# Patient Record
Sex: Female | Born: 1955
Health system: Southern US, Community
[De-identification: ages and names within clinical notes are randomized; demographics above are authoritative.]

## PROBLEM LIST (undated history)

## (undated) DIAGNOSIS — R519 Headache, unspecified: Secondary | ICD-10-CM

## (undated) DIAGNOSIS — M810 Age-related osteoporosis without current pathological fracture: Secondary | ICD-10-CM

## (undated) DIAGNOSIS — R51 Headache: Secondary | ICD-10-CM

## (undated) DIAGNOSIS — S322XXA Fracture of coccyx, initial encounter for closed fracture: Secondary | ICD-10-CM

## (undated) DIAGNOSIS — M1990 Unspecified osteoarthritis, unspecified site: Secondary | ICD-10-CM

## (undated) DIAGNOSIS — Z789 Other specified health status: Secondary | ICD-10-CM

## (undated) DIAGNOSIS — G43909 Migraine, unspecified, not intractable, without status migrainosus: Secondary | ICD-10-CM

## (undated) DIAGNOSIS — E079 Disorder of thyroid, unspecified: Secondary | ICD-10-CM

## (undated) DIAGNOSIS — M549 Dorsalgia, unspecified: Secondary | ICD-10-CM

## (undated) HISTORY — PX: WISDOM TOOTH EXTRACTION: SHX21

## (undated) HISTORY — DX: Headache, unspecified: R51.9

## (undated) HISTORY — DX: Migraine, unspecified, not intractable, without status migrainosus: G43.909

## (undated) HISTORY — DX: Unspecified osteoarthritis, unspecified site: M19.90

## (undated) HISTORY — DX: Disorder of thyroid, unspecified: E07.9

## (undated) HISTORY — DX: Other specified health status: Z78.9

## (undated) HISTORY — DX: Age-related osteoporosis without current pathological fracture: M81.0

## (undated) HISTORY — DX: Headache: R51

---

## 2013-01-02 ENCOUNTER — Emergency Department (HOSPITAL_COMMUNITY)
Admission: EM | Admit: 2013-01-02 | Discharge: 2013-01-02 | Disposition: A | Discharge status: Home / Self Care | Payer: No Typology Code available for payment source | Attending: Emergency Medicine | Admitting: Emergency Medicine

## 2013-01-02 ENCOUNTER — Emergency Department (HOSPITAL_COMMUNITY): Payer: No Typology Code available for payment source

## 2013-01-02 ENCOUNTER — Encounter (HOSPITAL_COMMUNITY): Payer: Self-pay | Admitting: Emergency Medicine

## 2013-01-02 DIAGNOSIS — S199XXA Unspecified injury of neck, initial encounter: Secondary | ICD-10-CM | POA: Insufficient documentation

## 2013-01-02 DIAGNOSIS — Z8719 Personal history of other diseases of the digestive system: Secondary | ICD-10-CM | POA: Insufficient documentation

## 2013-01-02 DIAGNOSIS — Z79899 Other long term (current) drug therapy: Secondary | ICD-10-CM | POA: Insufficient documentation

## 2013-01-02 DIAGNOSIS — Z8781 Personal history of (healed) traumatic fracture: Secondary | ICD-10-CM | POA: Insufficient documentation

## 2013-01-02 DIAGNOSIS — S0993XA Unspecified injury of face, initial encounter: Secondary | ICD-10-CM | POA: Insufficient documentation

## 2013-01-02 DIAGNOSIS — M542 Cervicalgia: Secondary | ICD-10-CM | POA: Diagnosis present

## 2013-01-02 DIAGNOSIS — Y92410 Unspecified street and highway as the place of occurrence of the external cause: Secondary | ICD-10-CM | POA: Insufficient documentation

## 2013-01-02 DIAGNOSIS — S6990XA Unspecified injury of unspecified wrist, hand and finger(s), initial encounter: Secondary | ICD-10-CM | POA: Insufficient documentation

## 2013-01-02 DIAGNOSIS — M79641 Pain in right hand: Secondary | ICD-10-CM | POA: Diagnosis present

## 2013-01-02 DIAGNOSIS — Y9389 Activity, other specified: Secondary | ICD-10-CM | POA: Insufficient documentation

## 2013-01-02 HISTORY — DX: Fracture of coccyx, initial encounter for closed fracture: S32.2XXA

## 2013-01-02 HISTORY — DX: Dorsalgia, unspecified: M54.9

## 2013-01-02 MED ORDER — CYCLOBENZAPRINE HCL 10 MG PO TABS: 10.0000 mg | ORAL_TABLET | Freq: Two times a day (BID) | ORAL | Status: DC | PRN

## 2013-01-02 MED ORDER — IBUPROFEN 400 MG PO TABS: 400.0000 mg | ORAL_TABLET | Freq: Four times a day (QID) | ORAL | Status: DC | PRN
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
## 2013-01-02 NOTE — Progress Notes (Signed)
Orthopedic Tech Progress Note Patient Details:  Kathy Zuniga Nov 15, 1955 [id number]  Ortho Devices Type of Ortho Device: Soft collar;Thumb velcro splint Ortho Device/Splint Interventions: Application   Cammer, Mickie Bail 01/02/2013, 3:39 PM
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
## 2013-01-07 NOTE — ED Provider Notes (Signed)
I saw and evaluated the patient, reviewed the resident's note and I agree with the findings and plan.  Vastie Douty T Shila Kruczek, MD 01/07/13 0838 

## 2013-01-27 ENCOUNTER — Encounter (HOSPITAL_COMMUNITY): Payer: Self-pay | Admitting: Emergency Medicine

## 2013-01-27 ENCOUNTER — Emergency Department (INDEPENDENT_AMBULATORY_CARE_PROVIDER_SITE_OTHER): Payer: PRIVATE HEALTH INSURANCE

## 2013-01-27 ENCOUNTER — Emergency Department (INDEPENDENT_AMBULATORY_CARE_PROVIDER_SITE_OTHER)
Admission: EM | Admit: 2013-01-27 | Discharge: 2013-01-27 | Disposition: A | Discharge status: Home / Self Care | Payer: PRIVATE HEALTH INSURANCE | Source: Home / Self Care

## 2013-01-27 DIAGNOSIS — M7062 Trochanteric bursitis, left hip: Secondary | ICD-10-CM

## 2013-01-27 DIAGNOSIS — M5430 Sciatica, unspecified side: Secondary | ICD-10-CM

## 2013-01-27 DIAGNOSIS — M25569 Pain in unspecified knee: Secondary | ICD-10-CM

## 2013-01-27 DIAGNOSIS — M76899 Other specified enthesopathies of unspecified lower limb, excluding foot: Secondary | ICD-10-CM

## 2013-01-27 DIAGNOSIS — M25562 Pain in left knee: Secondary | ICD-10-CM

## 2013-01-27 DIAGNOSIS — M5432 Sciatica, left side: Secondary | ICD-10-CM

## 2013-01-27 MED ORDER — DICLOFENAC SODIUM 75 MG PO TBEC: 75.0000 mg | DELAYED_RELEASE_TABLET | Freq: Two times a day (BID) | ORAL | Status: DC

## 2013-01-27 MED ORDER — METHOCARBAMOL 500 MG PO TABS: 500.0000 mg | ORAL_TABLET | Freq: Three times a day (TID) | ORAL | Status: DC | PRN

## 2013-01-27 MED ORDER — TRAMADOL HCL 50 MG PO TABS: 50.0000 mg | ORAL_TABLET | Freq: Four times a day (QID) | ORAL | Status: DC | PRN

## 2013-01-27 MED ORDER — METHYLPREDNISOLONE ACETATE 80 MG/ML IJ SUSP
INTRAMUSCULAR | Status: AC
  Filled 2013-01-27: qty 1

## 2013-01-27 MED ORDER — NALOXONE HCL 0.4 MG/ML IJ SOLN
INTRAMUSCULAR | Status: AC
  Filled 2013-01-27: qty 1
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
## 2013-01-27 NOTE — ED Notes (Signed)
Pt notified of appt w/[facility]) on 8/4 at 1100... Pt verbalized understanding;
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
## 2013-01-29 NOTE — ED Provider Notes (Signed)
Medical screening examination/treatment/procedure(s) were performed by resident physician or non-physician practitioner and as supervising physician I was immediately available for consultation/collaboration.   Mychele Seyller DOUGLAS MD.   Anahy Esh D Hugh Garrow, MD 01/29/13 1656 

## 2014-07-18 ENCOUNTER — Ambulatory Visit: Payer: Self-pay | Admitting: Obstetrics and Gynecology

## 2014-10-29 ENCOUNTER — Other Ambulatory Visit: Payer: Self-pay | Admitting: Internal Medicine

## 2014-10-29 ENCOUNTER — Ambulatory Visit: Payer: Self-pay

## 2014-10-29 DIAGNOSIS — M79661 Pain in right lower leg: Secondary | ICD-10-CM

## 2014-10-30 ENCOUNTER — Ambulatory Visit
Admission: RE | Admit: 2014-10-30 | Discharge: 2014-10-30 | Disposition: A | Discharge status: Home / Self Care | Payer: 59 | Source: Ambulatory Visit | Attending: Internal Medicine | Admitting: Internal Medicine

## 2014-10-30 DIAGNOSIS — M79661 Pain in right lower leg: Secondary | ICD-10-CM | POA: Insufficient documentation

## 2015-05-29 ENCOUNTER — Other Ambulatory Visit: Payer: Self-pay | Admitting: Internal Medicine

## 2015-05-29 ENCOUNTER — Ambulatory Visit
Admission: RE | Admit: 2015-05-29 | Discharge: 2015-05-29 | Disposition: A | Discharge status: Home / Self Care | Payer: 59 | Source: Ambulatory Visit | Attending: Internal Medicine | Admitting: Internal Medicine

## 2015-05-29 DIAGNOSIS — R52 Pain, unspecified: Secondary | ICD-10-CM

## 2015-07-11 DIAGNOSIS — K219 Gastro-esophageal reflux disease without esophagitis: Secondary | ICD-10-CM | POA: Insufficient documentation

## 2015-07-11 DIAGNOSIS — M810 Age-related osteoporosis without current pathological fracture: Secondary | ICD-10-CM | POA: Insufficient documentation

## 2015-07-18 ENCOUNTER — Other Ambulatory Visit: Payer: Self-pay | Admitting: Internal Medicine

## 2015-07-18 DIAGNOSIS — R102 Pelvic and perineal pain: Secondary | ICD-10-CM

## 2015-07-18 DIAGNOSIS — R1084 Generalized abdominal pain: Secondary | ICD-10-CM

## 2015-07-24 ENCOUNTER — Ambulatory Visit: Payer: No Typology Code available for payment source

## 2016-02-02 DIAGNOSIS — E876 Hypokalemia: Secondary | ICD-10-CM | POA: Diagnosis not present

## 2016-02-02 DIAGNOSIS — E871 Hypo-osmolality and hyponatremia: Secondary | ICD-10-CM | POA: Insufficient documentation

## 2016-02-02 DIAGNOSIS — Z79899 Other long term (current) drug therapy: Secondary | ICD-10-CM | POA: Diagnosis not present

## 2016-02-02 DIAGNOSIS — Z791 Long term (current) use of non-steroidal anti-inflammatories (NSAID): Secondary | ICD-10-CM | POA: Insufficient documentation

## 2016-02-02 DIAGNOSIS — R531 Weakness: Secondary | ICD-10-CM | POA: Diagnosis present

## 2016-02-03 ENCOUNTER — Ambulatory Visit
Admission: RE | Admit: 2016-02-03 | Payer: BLUE CROSS/BLUE SHIELD | Source: Ambulatory Visit | Admitting: Gastroenterology

## 2016-02-03 ENCOUNTER — Other Ambulatory Visit: Payer: Self-pay

## 2016-02-03 ENCOUNTER — Encounter: Admission: RE | Payer: Self-pay | Source: Ambulatory Visit

## 2016-02-03 ENCOUNTER — Emergency Department
Admission: EM | Admit: 2016-02-03 | Discharge: 2016-02-03 | Disposition: A | Discharge status: Home / Self Care | Payer: BLUE CROSS/BLUE SHIELD | Attending: Emergency Medicine | Admitting: Emergency Medicine

## 2016-02-03 ENCOUNTER — Encounter: Payer: Self-pay | Admitting: Emergency Medicine

## 2016-02-03 DIAGNOSIS — R531 Weakness: Secondary | ICD-10-CM

## 2016-02-03 DIAGNOSIS — E871 Hypo-osmolality and hyponatremia: Secondary | ICD-10-CM

## 2016-02-03 DIAGNOSIS — E876 Hypokalemia: Secondary | ICD-10-CM

## 2016-02-03 LAB — CBC WITH DIFFERENTIAL/PLATELET
BASOS ABS: 0 10*3/uL (ref 0–0.1)
BASOS PCT: 0 %
EOS ABS: 0 10*3/uL (ref 0–0.7)
Eosinophils Relative: 1 %
HCT: 36 % (ref 35.0–47.0)
HEMOGLOBIN: 12.8 g/dL (ref 12.0–16.0)
Lymphocytes Relative: 28 %
Lymphs Abs: 1.8 10*3/uL (ref 1.0–3.6)
MCH: 33.4 pg (ref 26.0–34.0)
MCHC: 35.5 g/dL (ref 32.0–36.0)
MCV: 93.9 fL (ref 80.0–100.0)
MONOS PCT: 9 %
Monocytes Absolute: 0.6 10*3/uL (ref 0.2–0.9)
NEUTROS ABS: 4.1 10*3/uL (ref 1.4–6.5)
NEUTROS PCT: 62 %
Platelets: 220 10*3/uL (ref 150–440)
RBC: 3.83 MIL/uL (ref 3.80–5.20)
RDW: 13.1 % (ref 11.5–14.5)
WBC: 6.5 10*3/uL (ref 3.6–11.0)

## 2016-02-03 LAB — COMPREHENSIVE METABOLIC PANEL
ALBUMIN: 3.9 g/dL (ref 3.5–5.0)
ALK PHOS: 60 U/L (ref 38–126)
ALT: 17 U/L (ref 14–54)
AST: 26 U/L (ref 15–41)
Anion gap: 9 (ref 5–15)
BUN: <5 mg/dL — ABNORMAL LOW (ref 6–20)
CALCIUM: 8.9 mg/dL (ref 8.9–10.3)
CO2: 22 mmol/L (ref 22–32)
CREATININE: 0.42 mg/dL — AB (ref 0.44–1.00)
Chloride: 96 mmol/L — ABNORMAL LOW (ref 101–111)
GFR calc non Af Amer: >60 mL/min (ref >60)
Glucose, Bld: 115 mg/dL — ABNORMAL HIGH (ref 65–99)
Potassium: 3.1 mmol/L — ABNORMAL LOW (ref 3.5–5.1)
SODIUM: 127 mmol/L — AB (ref 135–145)
TOTAL PROTEIN: 6.2 g/dL — AB (ref 6.5–8.1)
Total Bilirubin: 0.5 mg/dL (ref 0.3–1.2)

## 2016-02-03 LAB — LIPASE, BLOOD: LIPASE: 17 U/L (ref 11–51)

## 2016-02-03 LAB — GLUCOSE, CAPILLARY: GLUCOSE-CAPILLARY: 139 mg/dL — AB (ref 65–99)

## 2016-02-03 SURGERY — COLONOSCOPY WITH PROPOFOL
Anesthesia: General

## 2016-02-03 MED ORDER — SODIUM CHLORIDE 0.9 % IV BOLUS (SEPSIS)
500.0000 mL | INTRAVENOUS | Status: AC
  Administered 2016-02-03: 500 mL via INTRAVENOUS

## 2016-02-03 MED ORDER — POTASSIUM CHLORIDE CRYS ER 20 MEQ PO TBCR: 20.0000 meq | EXTENDED_RELEASE_TABLET | Freq: Every day | ORAL | 0 refills | Status: DC

## 2016-02-03 MED ORDER — POTASSIUM CHLORIDE CRYS ER 20 MEQ PO TBCR
40.0000 meq | EXTENDED_RELEASE_TABLET | Freq: Once | ORAL | Status: AC
  Administered 2016-02-03: 40 meq via ORAL
  Filled 2016-02-03: qty 2

## 2016-02-03 MED ORDER — ONDANSETRON HCL 4 MG/2ML IJ SOLN: 4.0000 mg | INTRAMUSCULAR | Status: DC

## 2016-02-03 NOTE — ED Notes (Signed)
Dr. Forbach at bedside.  

## 2016-02-03 NOTE — ED Triage Notes (Signed)
Pt to triage, dry heaving; began colonoscopy prep at 5pm and having nausea and weakness; pt denies any pain

## 2016-02-03 NOTE — Discharge Instructions (Signed)
As we discussed, your workup was generally unremarkable except for a slightly low sodium and potassium.  This is likely related to your bowel preparation.  Since you were not able to complete the bowel prep, we recommend you return to a normal diet and follow up with your regular doctor.  Be sure to call your GI clinic first thing this morning to let them know that he will not be able to come to your procedure.  Return to the emergency department if you develop new or worsening symptoms that concern you.
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
## 2016-02-17 ENCOUNTER — Telehealth: Payer: Self-pay | Admitting: Gastroenterology

## 2016-02-17 NOTE — Telephone Encounter (Signed)
colonoscopy

## 2016-02-18 ENCOUNTER — Other Ambulatory Visit: Payer: Self-pay

## 2016-02-18 NOTE — Telephone Encounter (Signed)
Gastroenterology Pre-Procedure Review  Request Date: 03/22/2016 Requesting Physician: Dr. Rosario Jacks  PATIENT REVIEW QUESTIONS: The patient responded to the following health history questions as indicated:    1. Are you having any GI issues? no 2. Do you have a personal history of Polyps? no 3. Do you have a family history of Colon Cancer or Polyps? yes (father colon cancer) 4. Diabetes Mellitus? no 5. Joint replacements in the past 12 months?no 6. Major health problems in the past 3 months?no 7. Any artificial heart valves, MVP, or defibrillator?no    MEDICATIONS & ALLERGIES:    Patient reports the following regarding taking any anticoagulation/antiplatelet therapy:   Plavix, Coumadin, Eliquis, Xarelto, Lovenox, Pradaxa, Brilinta, or Effient? no Aspirin? no  Patient confirms/reports the following medications:  Current Outpatient Prescriptions  Medication Sig Dispense Refill  . Probiotic Product (PROBIOTIC DAILY PO) Take 1 tablet by mouth daily.    . Turmeric Curcumin 500 MG CAPS Take by mouth.     No current facility-administered medications for this visit.     Patient confirms/reports the following allergies:  Allergies  Allergen Reactions  . Asa [Aspirin] Other (See Comments)    GI bleed Gi bleed   . Gluten Meal Other (See Comments)    Pt has Celiac's Pt has Celiac's  . Ibuprofen Other (See Comments)    GI bleed    No orders of the defined types were placed in this encounter.   AUTHORIZATION INFORMATION Primary Insurance: 1D#: Group #:  Secondary Insurance: 1D#: Group #:  SCHEDULE INFORMATION: Date: 03/22/2016 Time: Location: [facility]

## 2016-02-18 NOTE — Telephone Encounter (Signed)
Screening Colonoscopy Z12.11 [facility] [id number] Please pre cert

## 2016-03-04 ENCOUNTER — Telehealth: Payer: Self-pay | Admitting: Gastroenterology

## 2016-03-04 NOTE — Telephone Encounter (Signed)
Noted. Called [place] and cancelled.

## 2016-03-04 NOTE — Telephone Encounter (Signed)
Patient needs to cancel her appointment and will call back when she is ready. She is waiting to see another dr. I didn't call downstairs to cancel

## 2016-03-22 ENCOUNTER — Ambulatory Visit: Admit: 2016-03-22 | Payer: BLUE CROSS/BLUE SHIELD | Admitting: Gastroenterology

## 2016-03-22 SURGERY — COLONOSCOPY WITH PROPOFOL
Anesthesia: General

## 2016-05-15 ENCOUNTER — Emergency Department (HOSPITAL_COMMUNITY)
Admission: EM | Admit: 2016-05-15 | Discharge: 2016-05-15 | Disposition: A | Discharge status: Home / Self Care | Payer: BLUE CROSS/BLUE SHIELD | Attending: Emergency Medicine | Admitting: Emergency Medicine

## 2016-05-15 ENCOUNTER — Encounter (HOSPITAL_COMMUNITY): Payer: Self-pay | Admitting: *Deleted

## 2016-05-15 DIAGNOSIS — M545 Low back pain, unspecified: Secondary | ICD-10-CM

## 2016-05-15 DIAGNOSIS — R935 Abnormal findings on diagnostic imaging of other abdominal regions, including retroperitoneum: Secondary | ICD-10-CM | POA: Insufficient documentation

## 2016-05-15 DIAGNOSIS — K900 Celiac disease: Secondary | ICD-10-CM | POA: Insufficient documentation

## 2016-05-15 DIAGNOSIS — M549 Dorsalgia, unspecified: Secondary | ICD-10-CM | POA: Diagnosis present

## 2016-05-15 LAB — COMPREHENSIVE METABOLIC PANEL
ALBUMIN: 3.9 g/dL (ref 3.5–5.0)
ALT: 17 U/L (ref 14–54)
AST: 20 U/L (ref 15–41)
Alkaline Phosphatase: 55 U/L (ref 38–126)
Anion gap: 6 (ref 5–15)
BILIRUBIN TOTAL: 0.8 mg/dL (ref 0.3–1.2)
CHLORIDE: 107 mmol/L (ref 101–111)
CO2: 24 mmol/L (ref 22–32)
Calcium: 9.7 mg/dL (ref 8.9–10.3)
Creatinine, Ser: 0.51 mg/dL (ref 0.44–1.00)
GFR calc Af Amer: >60 mL/min (ref >60)
GFR calc non Af Amer: >60 mL/min (ref >60)
GLUCOSE: 94 mg/dL (ref 65–99)
POTASSIUM: 3.8 mmol/L (ref 3.5–5.1)
Sodium: 137 mmol/L (ref 135–145)
Total Protein: 6.4 g/dL — ABNORMAL LOW (ref 6.5–8.1)

## 2016-05-15 LAB — URINALYSIS, ROUTINE W REFLEX MICROSCOPIC
BILIRUBIN URINE: NEGATIVE
GLUCOSE, UA: NEGATIVE mg/dL
Hgb urine dipstick: NEGATIVE
KETONES UR: 15 mg/dL — AB
Leukocytes, UA: NEGATIVE
Nitrite: NEGATIVE
PH: 6 (ref 5.0–8.0)
Protein, ur: NEGATIVE mg/dL
Specific Gravity, Urine: 1.002 — ABNORMAL LOW (ref 1.005–1.030)

## 2016-05-15 LAB — CBC WITH DIFFERENTIAL/PLATELET
Basophils Absolute: 0 10*3/uL (ref 0.0–0.1)
Basophils Relative: 0 %
Eosinophils Absolute: 0 10*3/uL (ref 0.0–0.7)
Eosinophils Relative: 0 %
HEMATOCRIT: 39.5 % (ref 36.0–46.0)
Hemoglobin: 13.2 g/dL (ref 12.0–15.0)
LYMPHS PCT: 47 %
Lymphs Abs: 2.3 10*3/uL (ref 0.7–4.0)
MCH: 31.8 pg (ref 26.0–34.0)
MCHC: 33.4 g/dL (ref 30.0–36.0)
MCV: 95.2 fL (ref 78.0–100.0)
MONO ABS: 0.3 10*3/uL (ref 0.1–1.0)
MONOS PCT: 6 %
NEUTROS ABS: 2.3 10*3/uL (ref 1.7–7.7)
Neutrophils Relative %: 47 %
Platelets: 256 10*3/uL (ref 150–400)
RBC: 4.15 MIL/uL (ref 3.87–5.11)
RDW: 13 % (ref 11.5–15.5)
WBC: 4.9 10*3/uL (ref 4.0–10.5)

## 2016-05-15 LAB — LIPASE, BLOOD: Lipase: 30 U/L (ref 11–51)

## 2016-05-15 MED ORDER — GI COCKTAIL ~~LOC~~
30.0000 mL | Freq: Once | ORAL | Status: AC
  Administered 2016-05-15: 30 mL via ORAL
  Filled 2016-05-15: qty 30
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
## 2016-05-15 NOTE — Discharge Instructions (Signed)

## 2016-05-15 NOTE — ED Triage Notes (Addendum)
Pt has right flank pain x 2 days. Denies urinary symptoms but reports chills.

## 2016-06-03 ENCOUNTER — Encounter: Payer: Self-pay | Admitting: Certified Nurse Midwife

## 2016-06-06 ENCOUNTER — Encounter: Payer: Self-pay | Admitting: Internal Medicine

## 2016-06-07 ENCOUNTER — Ambulatory Visit (INDEPENDENT_AMBULATORY_CARE_PROVIDER_SITE_OTHER): Payer: BLUE CROSS/BLUE SHIELD | Admitting: Internal Medicine

## 2016-06-07 ENCOUNTER — Encounter: Payer: Self-pay | Admitting: Internal Medicine

## 2016-06-07 ENCOUNTER — Other Ambulatory Visit: Payer: Self-pay | Admitting: *Deleted

## 2016-06-07 VITALS — BP 118/82 | HR 82 | Temp 97.6°F | Ht 64.5 in | Wt 135.0 lb

## 2016-06-07 DIAGNOSIS — M810 Age-related osteoporosis without current pathological fracture: Secondary | ICD-10-CM

## 2016-06-07 DIAGNOSIS — K900 Celiac disease: Secondary | ICD-10-CM | POA: Insufficient documentation

## 2016-06-07 DIAGNOSIS — K580 Irritable bowel syndrome with diarrhea: Secondary | ICD-10-CM | POA: Diagnosis not present

## 2016-06-07 DIAGNOSIS — E039 Hypothyroidism, unspecified: Secondary | ICD-10-CM | POA: Diagnosis not present

## 2016-06-07 DIAGNOSIS — K219 Gastro-esophageal reflux disease without esophagitis: Secondary | ICD-10-CM | POA: Diagnosis not present

## 2016-06-07 DIAGNOSIS — M545 Low back pain, unspecified: Secondary | ICD-10-CM

## 2016-06-07 DIAGNOSIS — M549 Dorsalgia, unspecified: Secondary | ICD-10-CM

## 2016-06-07 DIAGNOSIS — G8929 Other chronic pain: Secondary | ICD-10-CM | POA: Insufficient documentation

## 2016-06-07 DIAGNOSIS — M546 Pain in thoracic spine: Secondary | ICD-10-CM

## 2016-06-07 NOTE — Patient Instructions (Signed)
Breast Self-Awareness Introduction Breast self-awareness means being familiar with how your breasts look and feel. It involves checking your breasts regularly and reporting any changes to your health care provider. Practicing breast self-awareness is important. A change in your breasts can be a sign of a serious medical problem. Being familiar with how your breasts look and feel allows you to find any problems early, when treatment is more likely to be successful. All women should practice breast self-awareness, including women who have had breast implants. How to do a breast self-exam One way to learn what is normal for your breasts and whether your breasts are changing is to do a breast self-exam. To do a breast self-exam: Look for Changes  1. Remove all the clothing above your waist. 2. Stand in front of a mirror in a room with good lighting. 3. Put your hands on your hips. 4. Push your hands firmly downward. 5. Compare your breasts in the mirror. Look for differences between them (asymmetry), such as:  Differences in shape.  Differences in size.  Puckers, dips, and bumps in one breast and not the other. 6. Look at each breast for changes in your skin, such as:  Redness.  Scaly areas. 7. Look for changes in your nipples, such as:  Discharge.  Bleeding.  Dimpling.  Redness.  A change in position. Feel for Changes  Carefully feel your breasts for lumps and changes. It is best to do this while lying on your back on the floor and again while sitting or standing in the shower or tub with soapy water on your skin. Feel each breast in the following way:  Place the arm on the side of the breast you are examining above your head.  Feel your breast with the other hand.  Start in the nipple area and make  inch (2 cm) overlapping circles to feel your breast. Use the pads of your three middle fingers to do this. Apply light pressure, then medium pressure, then firm pressure. The light  pressure will allow you to feel the tissue closest to the skin. The medium pressure will allow you to feel the tissue that is a little deeper. The firm pressure will allow you to feel the tissue close to the ribs.  Continue the overlapping circles, moving downward over the breast until you feel your ribs below your breast.  Move one finger-width toward the center of the body. Continue to use the  inch (2 cm) overlapping circles to feel your breast as you move slowly up toward your collarbone.  Continue the up and down exam using all three pressures until you reach your armpit. Write Down What You Find  Write down what is normal for each breast and any changes that you find. Keep a written record with breast changes or normal findings for each breast. By writing this information down, you do not need to depend only on memory for size, tenderness, or location. Write down where you are in your menstrual cycle, if you are still menstruating. If you are having trouble noticing differences in your breasts, do not get discouraged. With time you will become more familiar with the variations in your breasts and more comfortable with the exam. How often should I examine my breasts? Examine your breasts every month. If you are breastfeeding, the best time to examine your breasts is after a feeding or after using a breast pump. If you menstruate, the best time to examine your breasts is 5-7 days after your  period is over. During your period, your breasts are lumpier, and it may be more difficult to notice changes. When should I see my health care provider? See your health care provider if you notice:  A change in shape or size of your breasts or nipples.  A change in the skin of your breast or nipples, such as a reddened or scaly area.  Unusual discharge from your nipples.  A lump or thick area that was not there before.  Pain in your breasts.  Anything that concerns you. This information is not  intended to replace advice given to you by your health care provider. Make sure you discuss any questions you have with your health care provider. Document Released: 06/14/2005 Document Revised: 11/20/2015 Document Reviewed: 05/04/2015  2017 [employer]

## 2016-06-07 NOTE — Progress Notes (Signed)
Date:  06/07/2016   Name:  Kathy Zuniga   DOB:  24-Oct-1955   MRN:  [id number]   Chief Complaint: Establish Care Thyroid Problem  Presents for follow-up visit. Symptoms include diarrhea. Patient reports no anxiety, constipation, fatigue or palpitations. The symptoms have been stable (starting armour thyroid from Osteopath).  Back Pain  This is a chronic problem. The current episode started more than 1 year ago. The problem occurs intermittently. The pain is present in the lumbar spine. The pain does not radiate. The symptoms are aggravated by standing. Pertinent negatives include no abdominal pain, chest pain, dysuria or fever. Risk factors include history of osteoporosis. She has tried chiropractic manipulation and ice for the symptoms.   Menopausal symptoms - prefers Homeopathic medications.  Supposed to start HRT but is waiting to get her thyroid balanced.  Just started Armour Thyroid.  Osteoporosis - osteoporois at spine on DEXA 2016 and osteopenia at hip.  Has hx of fracture of coccyx 2012 and several digit fractures.  She does not want to take prescription medications.  She is starting thyroid supplements and will soon start HRT from [facility].  Bowel problems - she has bloating and gas with intermittent diarrhea.  She denies blood or weight loss.  Earlier this year tried to have a colonoscopy but became very sick from the prep - requiring IVF in ER as well as K+ supplementation.  She then tried to get one from Dr. Allen Norris but the prep was very expensive and she became to anxious to proceed.  She knows she needs to have the exam but would prefer to see GI first then schedule.  She once thought that she was sensitive to gluten but recent blood testing for food allergies was negative for grains.  She does not think that she ever had a celiac panel.  Review of Systems  Constitutional: Negative for chills, fatigue and fever.  Eyes: Negative for visual disturbance.  Respiratory: Negative  for cough, chest tightness, shortness of breath and wheezing.   Cardiovascular: Negative for chest pain, palpitations and leg swelling.  Gastrointestinal: Positive for abdominal distention and diarrhea. Negative for abdominal pain, blood in stool, constipation, rectal pain and vomiting.  Genitourinary: Positive for genital sores (intermittent labial inflammation -seen by GYN with normal exam). Negative for dysuria.  Musculoskeletal: Positive for back pain. Negative for gait problem and myalgias.  Skin: Negative for color change and rash.  Hematological: Negative for adenopathy.  Psychiatric/Behavioral: Negative for sleep disturbance. The patient is not nervous/anxious.     Patient Active Problem List   Diagnosis Date Noted  . Back pain, lumbosacral 06/07/2016  . Irritable bowel syndrome with diarrhea 06/07/2016  . GERD without esophagitis 07/11/2015  . Osteoporosis 07/11/2015    Prior to Admission medications   Medication Sig Start Date End Date Taking? Authorizing Provider  Probiotic Product (PROBIOTIC DAILY PO) Take 1 tablet by mouth daily.   Yes Historical Provider, MD  thyroid (ARMOUR) 15 MG tablet Take 15 mg by mouth daily.   Yes Historical Provider, MD  Turmeric Curcumin 500 MG CAPS Take 500 mg by mouth daily.    Yes Historical Provider, MD    Allergies  Allergen Reactions  . Almond Meal   . Asa [Aspirin] Other (See Comments)    GI bleed Gi bleed   . Crab [Shellfish Allergy]     clams  . Ibuprofen Other (See Comments)    GI bleed  . Other     Green Beans  .  Spinach     History reviewed. No pertinent surgical history.  Social History  Substance Use Topics  . Smoking status: Never Smoker  . Smokeless tobacco: Never Used  . Alcohol use No     Medication list has been reviewed and updated.   Physical Exam  Constitutional: She is oriented to person, place, and time. She appears well-developed. No distress.  HENT:  Head: Normocephalic and atraumatic.  Neck:  Normal range of motion. Neck supple. No thyromegaly present.  Cardiovascular: Normal rate, regular rhythm and normal heart sounds.   Pulmonary/Chest: Effort normal and breath sounds normal. No respiratory distress. She has no wheezes.  Abdominal: Soft. Bowel sounds are normal. She exhibits no distension. There is no tenderness. There is no rebound and no guarding.  Musculoskeletal: Normal range of motion. She exhibits no edema.       Thoracic back: She exhibits tenderness.       Lumbar back: She exhibits no tenderness and no spasm.  Neurological: She is alert and oriented to person, place, and time. She has normal reflexes.  Skin: Skin is warm and dry. No rash noted.  Psychiatric: She has a normal mood and affect. Her behavior is normal. Thought content normal.  Nursing note and vitals reviewed.   BP 118/82   Pulse 82   Temp 97.6 F (36.4 C)   Ht 5' 4.5" (1.638 m)   Wt 135 lb (61.2 kg)   BMI 22.81 kg/m   Assessment and Plan: 1. Back pain of thoracolumbar region Continue chiropractor, stretching and ice Avoid straining forward   2. Irritable bowel syndrome with diarrhea Needs to discuss symptoms as well as how to manage colon prep - Ambulatory referral to Gastroenterology  3. Osteoporosis without current pathological fracture, unspecified osteoporosis type Begin calcium; continue vitamin D Resume HRT in the near future DEXA at 3 year intervals  4. GERD without esophagitis On no medication currently.  5. Acquired hypothyroidism Mildly decreased T4 - starting armour thyroid  Halina Maidens, MD [facility]  06/07/2016

## 2016-07-05 ENCOUNTER — Ambulatory Visit: Payer: BLUE CROSS/BLUE SHIELD | Admitting: Gastroenterology

## 2016-07-13 ENCOUNTER — Encounter: Payer: Self-pay | Admitting: Certified Nurse Midwife

## 2016-08-10 ENCOUNTER — Ambulatory Visit: Payer: BLUE CROSS/BLUE SHIELD | Admitting: Gastroenterology

## 2016-08-17 ENCOUNTER — Ambulatory Visit: Payer: BLUE CROSS/BLUE SHIELD | Admitting: Certified Nurse Midwife

## 2016-08-17 ENCOUNTER — Encounter: Payer: Self-pay | Admitting: Certified Nurse Midwife

## 2016-08-17 VITALS — BP 116/70 | HR 72 | Resp 16 | Ht 64.25 in | Wt 131.0 lb

## 2016-08-17 DIAGNOSIS — Z01419 Encounter for gynecological examination (general) (routine) without abnormal findings: Secondary | ICD-10-CM | POA: Diagnosis not present

## 2016-08-17 DIAGNOSIS — Z124 Encounter for screening for malignant neoplasm of cervix: Secondary | ICD-10-CM | POA: Diagnosis not present

## 2016-08-17 DIAGNOSIS — M810 Age-related osteoporosis without current pathological fracture: Secondary | ICD-10-CM | POA: Diagnosis not present

## 2016-08-17 DIAGNOSIS — N898 Other specified noninflammatory disorders of vagina: Secondary | ICD-10-CM

## 2016-08-17 NOTE — Patient Instructions (Signed)

## 2016-08-17 NOTE — Progress Notes (Addendum)
61 y.o. G3P3 Married  Caucasian Fe here to establish gyn care as a new patient for an annual exam. Menopausal no HRT.  LMP approximately 4 years ago. Had one episode of bleeding then and none since. Some hot flashes occasional. Patient also complains of some vaginal irritation, has been on a real strict Candida diet and has been off for a few days due to company. Now back on. Previous history of chronic yeast infection and numerous medications. Diet works best.. She found that with decrease of sugar this works for her. Per patient has not noticed any vaginal  discharge. No itching today. Patient also has vaginal dryness.  Patient has osteoporosis. Sees [facility] for hypothyroid management and has recommended HRT and Testosterone after labs to treat osteoporosis. Patient has not had repeat BMD in 2 years. Was seeing PCP regarding and was not happy with traditional care. Not sure if she would consider medications due to side effects. Also will prefers no mammogram due to increase risk with xray. Plans thermography and aware of limitations.  No other health concerns today.  No LMP recorded. Patient is postmenopausal.  06/29/2011??        Sexually active: No.  The current method of family planning is post menopausal status.    Exercising: Yes.    walking, light weights Smoker:  no  Health Maintenance: Pap:  Per patient around 2016 no abnormals MMG:  Per patient about 7-8 years ago; Patient declines another mammogram. Has thermograms. Would choose not to have mammogram unless mass noted Colonoscopy:  Patient has not had colonoscopy. Has an appointment with GI, Dr. Vicente Males to discuss.. Unable to complete due to seizures with preparatory medications. Has ordered cologard to do at home.  BMD:   2 years ago per patient - Osteoporosis noted at that time per record  07/18/14 Spine L1-L4 -3.9, Right femur neck :-2.1 Osteopenia TDaP:  03/2014 per patient Shingles: never plans to take Pneumonia: never Hep C  and HIV: discuss today Labs: discuss today Self breast exam: occasionally  reports that she has never smoked. She has never used smokeless tobacco. She reports that she does not use drugs.  Past Medical History:  Diagnosis Date  . Back pain   . Celiac disease   . Fracture of coccyx ([facility])   . Osteoporosis   . Thyroid disease     History reviewed. No pertinent surgical history.  Current Outpatient Prescriptions  Medication Sig Dispense Refill  . Cholecalciferol (VITAMIN D3) 5000 units TABS Take by mouth.    . Cyanocobalamin (BL VITAMIN B-12 PO) Take by mouth.    . Glutamine POWD Take by mouth.    Marland Kitchen MAGNESIUM MALATE PO Take by mouth.    . Menaquinone-7 (VITAMIN K2 PO) Take by mouth.    . Nutritional Supplements (DHEA PO) Take by mouth.    . Probiotic Product (PROBIOTIC DAILY PO) Take 1 tablet by mouth daily.    Marland Kitchen thyroid (ARMOUR) 15 MG tablet Take 15 mg by mouth daily.    . Turmeric Curcumin 500 MG CAPS Take 500 mg by mouth daily.      No current facility-administered medications for this visit.     Family History  Problem Relation Age of Onset  . Osteoporosis Mother   . Colon cancer Father   . Heart failure Father     ROS:  Pertinent items are noted in HPI.  Otherwise, a comprehensive ROS was negative.  Exam:   BP 116/70 (BP Location: Right Arm, Patient  Position: Sitting, Cuff Size: Normal)   Pulse 72   Resp 16   Ht 5' 4.25" (1.632 m)   Wt 131 lb (59.4 kg)   BMI 22.31 kg/m  Height: 5' 4.25" (163.2 cm) Ht Readings from Last 3 Encounters:  08/17/16 5' 4.25" (1.632 m)  06/07/16 5' 4.5" (1.638 m)  02/03/16 5\' 6"  (1.676 m)    General appearance: alert, cooperative and appears stated age Head: Normocephalic, without obvious abnormality, atraumatic Neck: no adenopathy, supple, symmetrical, trachea midline and thyroid normal to inspection and palpation Lungs: clear to auscultation bilaterally Breasts: normal appearance, no masses or tenderness, No nipple retraction or  dimpling, No nipple discharge or bleeding, No axillary or supraclavicular adenopathy Heart: regular rate and rhythm Abdomen: soft, non-tender; no masses,  no organomegaly Extremities: extremities normal, atraumatic, no cyanosis or edema Skin: Skin color, texture, turgor normal. No rashes or lesions Lymph nodes: Cervical, supraclavicular, and axillary nodes normal. No abnormal inguinal nodes palpated Neurologic: Grossly normal   Pelvic: External genitalia:  no lesions              Urethra:  normal appearing urethra with no masses, tenderness or lesions              Bartholin's and Skene's: normal                 Vagina: normal appearing vagina with normal color and discharge, no lesions              Cervix: multiparous appearance, no cervical motion tenderness and no lesions              Pap taken: Yes.   Bimanual Exam:  Uterus:  normal size, contour, position, consistency, mobility, non-tender              Adnexa: normal adnexa and no mass, fullness, tenderness               Rectovaginal: Confirms               Anus:  normal sphincter tone, no lesions  Chaperone present: yes  A:  Well Woman with normal exam  Menopausal no HRT  Atrophic vaginitis  Chronic yeast history R/O infection  Osteoporosis history BMD due, no therapy at present, considering HRT with [facility]  Mammogram due declines will have thermography  Hypothyroid with [facility] management  P:   Reviewed health and wellness pertinent to exam  Discussed importance of advising if vaginal bleeding.  Discussed vaginal findings and options for treatment. Estrogen or OTC, risks/benefits given. Will try coconut oil, instructions given. Will advise if no change.  Lab: Affirm will treat if indicated  Discussed risks with Osteoporosis of fracture and would like to see actual BMD report and need to have up to date one now. Patient agreeable to scheduling, will do prior to leaving today. Discussed importance  of calcium and Vitamin D, and weight bearing exercise. had labs with [facility]. Normal per patient.  Discussed limitations and no standard for thermography, patient aware and prefers and will schedule. Will consent to mammogram if thermography is abnormal.  Continue follow up as indicated.  Pap smear as above with HPVHR   counseled on breast self exam, mammography screening, use and side effects of HRT after menopause, adequate intake of calcium and vitamin D, diet and exercise  return annually or prn  An After Visit Summary was printed and given to the patient.

## 2016-08-18 LAB — WET PREP BY MOLECULAR PROBE
CANDIDA SPECIES: NEGATIVE
GARDNERELLA VAGINALIS: NEGATIVE
Trichomonas vaginosis: NEGATIVE

## 2016-08-18 NOTE — Progress Notes (Signed)
Encounter reviewed Jill Jertson, MD   

## 2016-08-19 LAB — IPS PAP TEST WITH HPV

## 2016-09-01 ENCOUNTER — Ambulatory Visit: Payer: BLUE CROSS/BLUE SHIELD | Admitting: Gastroenterology

## 2016-09-07 ENCOUNTER — Ambulatory Visit (INDEPENDENT_AMBULATORY_CARE_PROVIDER_SITE_OTHER): Payer: BLUE CROSS/BLUE SHIELD | Admitting: Internal Medicine

## 2016-09-07 ENCOUNTER — Ambulatory Visit
Admission: RE | Admit: 2016-09-07 | Discharge: 2016-09-07 | Disposition: A | Discharge status: Home / Self Care | Payer: BLUE CROSS/BLUE SHIELD | Source: Ambulatory Visit | Attending: Internal Medicine | Admitting: Internal Medicine

## 2016-09-07 ENCOUNTER — Encounter: Payer: Self-pay | Admitting: Internal Medicine

## 2016-09-07 VITALS — BP 112/64 | HR 78 | Ht 64.25 in | Wt 128.8 lb

## 2016-09-07 DIAGNOSIS — M546 Pain in thoracic spine: Secondary | ICD-10-CM | POA: Insufficient documentation

## 2016-09-07 DIAGNOSIS — M8580 Other specified disorders of bone density and structure, unspecified site: Secondary | ICD-10-CM | POA: Insufficient documentation

## 2016-09-07 DIAGNOSIS — M419 Scoliosis, unspecified: Secondary | ICD-10-CM | POA: Insufficient documentation

## 2016-09-07 MED ORDER — METHOCARBAMOL 500 MG PO TABS: 500.0000 mg | ORAL_TABLET | Freq: Three times a day (TID) | ORAL | 0 refills | Status: DC | PRN

## 2016-09-07 NOTE — Progress Notes (Signed)
Date:  09/07/2016   Name:  Kathy Zuniga   DOB:  [birth year]   MRN:  [id number]   Chief Complaint: Back Pain (Started X 5 days ago. Was so bad had nausea with pain. No injury but "may have layed awkwardly on side."  Was sick with the flu when this occured. Pain is a level of  "above 10.") Back Pain  This is a new problem. The current episode started in the past 7 days. The problem occurs constantly. The problem has been gradually improving since onset. The pain is present in the thoracic spine. The quality of the pain is described as aching. The pain does not radiate. The pain is at a severity of 4/10. The pain is moderate. The symptoms are aggravated by lying down. Pertinent negatives include no abdominal pain, bladder incontinence, bowel incontinence, chest pain, dysuria, fever, headaches or weakness. Treatments tried: bone support and Hemp oil po. The treatment provided mild relief.   She is going to [facility] service for bone therapy support medications and Hemp oil for pain. She is also now on HRT. She very wary regarding plain xrays and dx value.  She is not interested in kyphoplasty if she has a compression fx.  She has osteoporosis and has declined traditional medicine in the past in favor of homeopathic remedies and supplements.   Review of Systems  Constitutional: Negative for chills, fatigue and fever.  Respiratory: Negative for cough, chest tightness and shortness of breath.   Cardiovascular: Negative for chest pain, palpitations and leg swelling.  Gastrointestinal: Negative for abdominal pain and bowel incontinence.  Genitourinary: Negative for bladder incontinence, dysuria and hematuria.  Musculoskeletal: Positive for back pain. Negative for gait problem, neck pain and neck stiffness.  Neurological: Negative for dizziness, tremors, weakness and headaches.  Hematological: Negative for adenopathy.    Patient Active Problem List   Diagnosis Date Noted  . Back  pain, lumbosacral 06/07/2016  . Irritable bowel syndrome with diarrhea 06/07/2016  . GERD without esophagitis 07/11/2015  . Osteoporosis 07/11/2015    Prior to Admission medications   Medication Sig Start Date End Date Taking? Authorizing Provider  Cholecalciferol (VITAMIN D3) 5000 units TABS Take by mouth.   Yes Historical Provider, MD  Cyanocobalamin (BL VITAMIN B-12 PO) Take by mouth.   Yes Historical Provider, MD  Glutamine POWD Take by mouth.   Yes Historical Provider, MD  MAGNESIUM MALATE PO Take by mouth.   Yes Historical Provider, MD  Menaquinone-7 (VITAMIN K2 PO) Take by mouth.   Yes Historical Provider, MD  Nutritional Supplements (DHEA PO) Take by mouth.   Yes Historical Provider, MD  Probiotic Product (PROBIOTIC DAILY PO) Take 1 tablet by mouth daily.   Yes Historical Provider, MD  thyroid (ARMOUR) 15 MG tablet Take 15 mg by mouth daily.   Yes Historical Provider, MD    Allergies  Allergen Reactions  . Almond Meal   . Asa [Aspirin] Other (See Comments)    GI bleed Gi bleed   . Crab [Shellfish Allergy]     clams  . Ibuprofen Other (See Comments)    GI bleed  . Other     Green Beans  . Spinach     History reviewed. No pertinent surgical history.  Social History  Substance Use Topics  . Smoking status: Never Smoker  . Smokeless tobacco: Never Used  . Alcohol use Not on file     Comment: occasionally     Medication list has been reviewed  and updated.   Physical Exam  Constitutional: She is oriented to person, place, and time. She appears well-developed. No distress.  HENT:  Head: Normocephalic and atraumatic.  Neck: No thyromegaly present.  Cardiovascular: Normal rate, regular rhythm and normal heart sounds.   Pulmonary/Chest: Effort normal and breath sounds normal. No respiratory distress. She has no wheezes.  Musculoskeletal: Normal range of motion.       Thoracic back: She exhibits spasm. She exhibits no bony tenderness and no swelling.    Neurological: She is alert and oriented to person, place, and time. She has normal strength. No sensory deficit.  Skin: Skin is warm and dry. No rash noted.  Psychiatric: She has a normal mood and affect. Her speech is normal and behavior is normal. Thought content normal.  Nursing note and vitals reviewed.   BP 112/64   Pulse 78   Ht 5' 4.25" (1.632 m)   Wt 128 lb 12.8 oz (58.4 kg)   SpO2 99%   BMI 21.94 kg/m   Assessment and Plan: 1. Acute bilateral thoracic back pain Suspect compression fx Continue current management - DG THORACOLUMABAR SPINE; Future - methocarbamol (ROBAXIN) 500 MG tablet; Take 1 tablet (500 mg total) by mouth every 8 (eight) hours as needed for muscle spasms.  Dispense: 30 tablet; Refill: 0   Meds ordered this encounter  Medications  . methocarbamol (ROBAXIN) 500 MG tablet    Sig: Take 1 tablet (500 mg total) by mouth every 8 (eight) hours as needed for muscle spasms.    Dispense:  30 tablet    Refill:  0    Halina Maidens, MD [facility]  09/07/2016

## 2016-09-08 NOTE — Progress Notes (Signed)
Pt asking to still do MRI- stating she viewed the report on My Chart and read MRI can happen now?

## 2016-09-16 ENCOUNTER — Encounter: Payer: Self-pay | Admitting: Internal Medicine

## 2016-09-27 ENCOUNTER — Encounter: Payer: Self-pay | Admitting: Internal Medicine

## 2016-09-28 ENCOUNTER — Ambulatory Visit: Payer: BLUE CROSS/BLUE SHIELD | Admitting: Gastroenterology

## 2016-12-06 ENCOUNTER — Encounter: Payer: Self-pay | Admitting: Internal Medicine

## 2016-12-06 ENCOUNTER — Ambulatory Visit (INDEPENDENT_AMBULATORY_CARE_PROVIDER_SITE_OTHER): Payer: BLUE CROSS/BLUE SHIELD | Admitting: Internal Medicine

## 2016-12-06 VITALS — BP 100/70 | HR 67 | Temp 98.0°F | Resp 17 | Ht 64.25 in | Wt 131.0 lb

## 2016-12-06 DIAGNOSIS — K580 Irritable bowel syndrome with diarrhea: Secondary | ICD-10-CM | POA: Diagnosis not present

## 2016-12-06 DIAGNOSIS — M546 Pain in thoracic spine: Secondary | ICD-10-CM | POA: Diagnosis not present

## 2016-12-06 DIAGNOSIS — K649 Unspecified hemorrhoids: Secondary | ICD-10-CM | POA: Insufficient documentation

## 2016-12-06 DIAGNOSIS — G8929 Other chronic pain: Secondary | ICD-10-CM | POA: Insufficient documentation

## 2016-12-06 DIAGNOSIS — M654 Radial styloid tenosynovitis [de Quervain]: Secondary | ICD-10-CM | POA: Insufficient documentation

## 2016-12-06 NOTE — Progress Notes (Signed)
Date:  12/06/2016   Name:  Kathy Zuniga   DOB:  Nov 02, 1955   MRN:  [id number]   Chief Complaint: Follow-up Back Pain  This is a chronic problem. The problem occurs daily. The problem has been gradually worsening since onset. The pain is present in the thoracic spine. The quality of the pain is described as aching and stabbing. The pain does not radiate. The symptoms are aggravated by bending and twisting. Pertinent negatives include no abdominal pain, bladder incontinence, bowel incontinence, chest pain, fever, numbness or weakness. She has tried chiropractic manipulation (hemp oil extract) for the symptoms. The treatment provided moderate (she has known mild scoliosis, probably mild thoracic compression fracture earlier this year.  Had to stop working due to pain.  Has never seen Ortho.  Is considering applying for disability.) relief.  Rectal Bleeding   The current episode started more than 2 weeks ago. The onset was sudden. The problem has been unchanged. The patient is experiencing no pain. The stool is described as soft and bloody. Pertinent negatives include no fever, no abdominal pain, no diarrhea and no chest pain.    Review of Systems  Constitutional: Negative for chills, fatigue and fever.  Eyes: Negative for visual disturbance.  Respiratory: Negative for choking, shortness of breath and wheezing.   Cardiovascular: Negative for chest pain, palpitations and leg swelling.  Gastrointestinal: Positive for blood in stool and hematochezia. Negative for abdominal pain, bowel incontinence, constipation and diarrhea.  Genitourinary: Negative for bladder incontinence and difficulty urinating.  Musculoskeletal: Positive for arthralgias and back pain. Negative for gait problem.  Neurological: Negative for dizziness, tremors, syncope, weakness and numbness.  Psychiatric/Behavioral: Negative for sleep disturbance.    Patient Active Problem List   Diagnosis Date Noted  . Radial styloid  tenosynovitis 12/06/2016  . Back pain, lumbosacral 06/07/2016  . Irritable bowel syndrome with diarrhea 06/07/2016  . GERD without esophagitis 07/11/2015  . Osteoporosis 07/11/2015    Prior to Admission medications   Medication Sig Start Date End Date Taking? Authorizing Provider  Cholecalciferol (VITAMIN D3) 5000 units TABS Take 5,000 each by mouth daily.    Yes [provider]  Cyanocobalamin (BL VITAMIN B-12 PO) Take by mouth.   Yes [provider]  estradiol (CLIMARA - DOSED IN MG/24 HR) 0.025 mg/24hr patch Place 0.025 mg onto the skin every 3 (three) days.   Yes [provider]  Glutamine POWD Take by mouth.   Yes [provider]  MAGNESIUM MALATE PO Take by mouth.   Yes [provider]  Menaquinone-7 (VITAMIN K2 PO) Take by mouth.   Yes [provider]  Nutritional Supplements (DHEA PO) Take by mouth.   Yes [provider]  Probiotic Product (PROBIOTIC DAILY PO) Take 1 tablet by mouth daily.   Yes [provider]  progesterone (PROMETRIUM) 100 MG capsule Take 100 mg by mouth daily.   Yes [provider]  thyroid (ARMOUR) 15 MG tablet Take 15 mg by mouth daily.   Yes [provider]    Allergies  Allergen Reactions  . Almond Meal   . Asa [Aspirin] Other (See Comments)    GI bleed Gi bleed   . Crab [Shellfish Allergy]     clams  . Ibuprofen Other (See Comments)    GI bleed  . Other     Green Beans  . Spinach     No past surgical history on file.  Social History  Substance Use Topics  . Smoking status:  Never Smoker  . Smokeless tobacco: Never Used  . Alcohol use Not on file     Comment: occasionally     Medication list has been reviewed and updated.   Physical Exam  Constitutional: She is oriented to person, place, and time. She appears well-developed. No distress.  HENT:  Head: Normocephalic and atraumatic.  Neck: Normal range of motion. Neck supple.  Cardiovascular:  Normal rate, regular rhythm and normal heart sounds.   Pulmonary/Chest: Effort normal and breath sounds normal. No respiratory distress. She has no wheezes.  Genitourinary:     Musculoskeletal: Normal range of motion.       Thoracic back: She exhibits tenderness (paraspinous muscles bilaterally).  Neurological: She is alert and oriented to person, place, and time. She has normal strength and normal reflexes. No sensory deficit.  Skin: Skin is warm and dry. No rash noted.  Psychiatric: She has a normal mood and affect. Her behavior is normal. Thought content normal.  Nursing note and vitals reviewed.   BP 100/70 (BP Location: Left Arm, Patient Position: Sitting, Cuff Size: Normal)   Pulse 67   Temp 98 F (36.7 C) (Oral)   Resp 17   Ht 5' 4.25" (1.632 m)   Wt 131 lb (59.4 kg)   SpO2 99%   BMI 22.31 kg/m   Assessment and Plan: 1. Chronic midline thoracic back pain With mild osteopenia/compression fx Pt advised that she would need to see Ortho for further evaluation - esp if she wants to apply for disability Continue Hemp oil for pain  2. Irritable bowel syndrome with diarrhea Seeing GI later this month to discuss colonoscopy  3. Hemorrhoids, unspecified hemorrhoid type Topical ointment as needed   No orders of the defined types were placed in this encounter.   Halina Maidens, MD [facility]  12/06/2016

## 2016-12-23 ENCOUNTER — Telehealth: Payer: Self-pay

## 2016-12-23 ENCOUNTER — Other Ambulatory Visit: Payer: Self-pay | Admitting: Internal Medicine

## 2016-12-23 DIAGNOSIS — G8929 Other chronic pain: Secondary | ICD-10-CM

## 2016-12-23 DIAGNOSIS — M810 Age-related osteoporosis without current pathological fracture: Secondary | ICD-10-CM

## 2016-12-23 DIAGNOSIS — M546 Pain in thoracic spine: Secondary | ICD-10-CM

## 2016-12-23 NOTE — Telephone Encounter (Signed)
Patient called stating she had xray in march showing small possible fracture at 6 in spine. She believes she has another fracture. Patient would like to be sent to orthopedics. Informed Dr. Army Melia and referral sent through her. Told pt they will call her to schedule that appt.

## 2016-12-27 LAB — HM DEXA SCAN: HM Dexa Scan: ABNORMAL

## 2016-12-28 ENCOUNTER — Encounter: Payer: Self-pay | Admitting: Certified Nurse Midwife

## 2016-12-28 ENCOUNTER — Encounter: Payer: Self-pay | Admitting: Gastroenterology

## 2016-12-28 ENCOUNTER — Ambulatory Visit (INDEPENDENT_AMBULATORY_CARE_PROVIDER_SITE_OTHER): Payer: BLUE CROSS/BLUE SHIELD | Admitting: Gastroenterology

## 2016-12-28 ENCOUNTER — Other Ambulatory Visit: Payer: Self-pay

## 2016-12-28 VITALS — BP 114/73 | HR 69 | Temp 97.8°F | Ht 64.25 in | Wt 132.4 lb

## 2016-12-28 DIAGNOSIS — K625 Hemorrhage of anus and rectum: Secondary | ICD-10-CM | POA: Diagnosis not present

## 2016-12-28 NOTE — Progress Notes (Signed)
Jonathon Bellows MD, MRCP(U.K) [address]  [place], [place] [address]  Main: [phone]  Fax: [phone]   Gastroenterology Consultation  Referring Provider:     Glean Hess, MD Primary Care Physician:  Glean Hess, MD Primary Gastroenterologist:  Dr. Jonathon Bellows  Reason for Consultation:     Hemorrhoids         HPI:   Kathy Zuniga is a 61 y.o. y/o female referred for consultation & management  by Dr. Army Melia, Jesse Sans, MD.    She says a few months back saw some blood on the toilet paper which resolved after about 5 episodes. Last month she says that on one occasion saw some blood on the stool. Bright red in color. When she saw her primary doctor was told she may have hemorrhoids. She denies any pain during the bowel movement . Her father had colon cancer. She has never had a colonoscopy . She says she has normal bowel movements , some change in the shape of her stools which has been more "thready " , normal consistency , no weight loss. Not formally diagnosed with celiac disease but has stayed away from Gluten.   She says that once previously was all set up for her colonoscopy - did not complete the prep , at that time was told she had very low potassium.  Past Medical History:  Diagnosis Date  . Back pain   . Celiac disease   . Fracture of coccyx ([place])   . Osteoporosis   . Thyroid disease     No past surgical history on file.  Prior to Admission medications   Medication Sig Start Date End Date Taking? Authorizing Provider  Cholecalciferol (VITAMIN D3) 5000 units TABS Take 5,000 each by mouth daily.     [provider]  Cyanocobalamin (BL VITAMIN B-12 PO) Take by mouth.    [provider]  estradiol (CLIMARA - DOSED IN MG/24 HR) 0.025 mg/24hr patch Place 0.025 mg onto the skin every 3 (three) days.    [provider]  Glutamine POWD Take by mouth.    [provider]  MAGNESIUM MALATE PO Take by mouth.     [provider]  Menaquinone-7 (VITAMIN K2 PO) Take by mouth.    [provider]  NP THYROID 90 MG tablet  12/08/16   [provider]  Nutritional Supplements (DHEA PO) Take by mouth.    [provider]  Probiotic Product (PROBIOTIC DAILY PO) Take 1 tablet by mouth daily.    [provider]  progesterone (PROMETRIUM) 100 MG capsule Take 100 mg by mouth daily.    [provider]  thyroid (ARMOUR) 15 MG tablet Take 15 mg by mouth daily.    [provider]    Family History  Problem Relation Age of Onset  . Osteoporosis Mother   . Colon cancer Father   . Heart failure Father      Social History  Substance Use Topics  . Smoking status: Never Smoker  . Smokeless tobacco: Never Used  . Alcohol use Not on file     Comment: occasionally    Allergies as of 12/28/2016 - Review Complete 12/06/2016  Allergen Reaction Noted  . Almond meal  05/15/2016  . Asa [aspirin] Other (See Comments) 07/11/2015  . Crab [shellfish allergy]  05/15/2016  . Ibuprofen Other (See Comments) 07/11/2015  . Other  05/15/2016  . Spinach  05/15/2016    Review of Systems:  All systems reviewed and negative except where noted in HPI.   Physical Exam:  BP 114/73 (BP Location: Left Arm, Patient Position: Sitting, Cuff Size: Normal)   Pulse 69   Temp 97.8 F (36.6 C) (Oral)   Ht 5' 4.25" (1.632 m)   Wt 132 lb 6.4 oz (60.1 kg)   BMI 22.55 kg/m  No LMP recorded. Patient is postmenopausal. Psych:  Alert and cooperative. Normal mood and affect. General:   Alert,  Well-developed, well-nourished, pleasant and cooperative in NAD Head:  Normocephalic and atraumatic. Eyes:  Sclera clear, no icterus.   Conjunctiva pink. Ears:  Normal auditory acuity. Nose:  No deformity, discharge, or lesions. Mouth:  No deformity or lesions,oropharynx pink & moist. Neck:  Supple; no masses or thyromegaly. Lungs:  Respirations even and unlabored.  Clear throughout to  auscultation.   No wheezes, crackles, or rhonchi. No acute distress. Heart:  Regular rate and rhythm; no murmurs, clicks, rubs, or gallops. Abdomen:  Normal bowel sounds.  No bruits.  Soft, non-tender and non-distended without masses, hepatosplenomegaly or hernias noted.  No guarding or rebound tenderness.    Extremities:  No clubbing or edema.  No cyanosis. Neurologic:  Alert and oriented x3;  grossly normal neurologically. Psych:  Alert and cooperative. Normal mood and affect.  Imaging Studies: No results found.  Assessment and Plan:   Kathy Zuniga is a 61 y.o. y/o female has been referred for rectal bleeding .Never had a colonoscopy .Father had colon cancer.    Plan  1. Diagnostic colonoscopy   Follow up PRN  Dr Jonathon Bellows MD,MRCP(U.K)

## 2017-01-05 ENCOUNTER — Telehealth: Payer: Self-pay | Admitting: Gastroenterology

## 2017-01-05 ENCOUNTER — Other Ambulatory Visit: Payer: Self-pay | Admitting: Orthopedic Surgery

## 2017-01-05 DIAGNOSIS — M546 Pain in thoracic spine: Secondary | ICD-10-CM

## 2017-01-05 NOTE — Telephone Encounter (Signed)
Patient called and needs to r/s her colonoscopy on 01/20/17.

## 2017-01-06 NOTE — Telephone Encounter (Signed)
Returned pts call to reschedule colonoscopy to 02/24/17 [facility]. Notified Vicki in endo to change date on schedule.

## 2017-01-11 ENCOUNTER — Telehealth: Payer: Self-pay | Admitting: *Deleted

## 2017-01-11 NOTE — Telephone Encounter (Signed)
Spoke with patient. Patient states she had BMD done couple of weeks ago at [facility]. Advised patient RN would call to request report, will return call once received and reviewed by Melvia Heaps, CNM. Patient verbalizes understanding and is agreeable.

## 2017-01-11 NOTE — Telephone Encounter (Signed)
Spoke with Helmut Muster in medical records. Requested copy of recent BMD ordered by Melvia Heaps, CNM. Was advised will fax results to [phone].   Routing to Cisco, CNM FYI.

## 2017-01-11 NOTE — Telephone Encounter (Signed)
-----   Message from Regina Eck, CNM sent at 01/11/2017 12:32 PM EDT ----- Regarding: BMD message in messages I do not see where she has had one ordered or done. She was to call per note and schedule. Can you check on this/ Thanks Debbi

## 2017-01-11 NOTE — Telephone Encounter (Signed)
BMD results placed on Melvia Heaps, CNM desk for review.

## 2017-01-12 ENCOUNTER — Telehealth: Payer: Self-pay

## 2017-01-12 ENCOUNTER — Ambulatory Visit: Payer: BLUE CROSS/BLUE SHIELD

## 2017-01-12 NOTE — Telephone Encounter (Signed)
Pt notified of bmd results. Pt states she has already spoken to her orthopedic dr & they have her seeing someone who specializes in that in the next couple weeks & after that appointment if she needs to come in for an appt after what they tell her then she will call our office. Pt states she is seeing someone who is taking care of her thyroid & giving her medication for it. Routing to Johny Shock, for final review.

## 2017-01-12 NOTE — Telephone Encounter (Signed)
Will close encounter

## 2017-01-12 NOTE — Telephone Encounter (Signed)
Patient called with results see note

## 2017-01-17 ENCOUNTER — Encounter: Payer: Self-pay | Admitting: Internal Medicine

## 2017-01-20 ENCOUNTER — Telehealth: Payer: Self-pay | Admitting: Certified Nurse Midwife

## 2017-01-20 NOTE — Telephone Encounter (Signed)
Patient said she was given a name of a primary care provider from Mrs Debbi and does not remember the name.

## 2017-01-20 NOTE — Telephone Encounter (Signed)
Kathy Zuniga

## 2017-01-20 NOTE — Telephone Encounter (Signed)
Routing to Cisco CNM for review. Patient was last seen on 08/17/16.

## 2017-01-21 NOTE — Telephone Encounter (Signed)
Spoke with patient. Advised of message as seen below from [place]. Provided contact information below to Colin Benton D.O. Patient is agreeable to call and schedule appointment.  [place], [place]: [phone]  Routing to provider for final review. Patient agreeable to disposition. Will close encounter.

## 2017-01-24 ENCOUNTER — Encounter: Payer: Self-pay | Admitting: Certified Nurse Midwife

## 2017-02-03 ENCOUNTER — Telehealth: Payer: Self-pay | Admitting: Gastroenterology

## 2017-02-03 NOTE — Telephone Encounter (Signed)
Patient canceled colonoscopy and I put her in with Dr. Marius Ditch for an office visit. I called [facility] to cancel

## 2017-02-15 ENCOUNTER — Other Ambulatory Visit: Payer: Self-pay

## 2017-02-15 ENCOUNTER — Encounter: Payer: Self-pay | Admitting: Family Medicine

## 2017-02-15 ENCOUNTER — Ambulatory Visit (INDEPENDENT_AMBULATORY_CARE_PROVIDER_SITE_OTHER): Payer: BLUE CROSS/BLUE SHIELD | Admitting: Family Medicine

## 2017-02-15 VITALS — BP 98/60 | HR 68 | Temp 98.4°F | Ht 64.25 in | Wt 139.9 lb

## 2017-02-15 DIAGNOSIS — M549 Dorsalgia, unspecified: Secondary | ICD-10-CM | POA: Diagnosis not present

## 2017-02-15 DIAGNOSIS — G8929 Other chronic pain: Secondary | ICD-10-CM | POA: Diagnosis not present

## 2017-02-15 DIAGNOSIS — M810 Age-related osteoporosis without current pathological fracture: Secondary | ICD-10-CM | POA: Diagnosis not present

## 2017-02-15 DIAGNOSIS — K219 Gastro-esophageal reflux disease without esophagitis: Secondary | ICD-10-CM

## 2017-02-15 DIAGNOSIS — Z789 Other specified health status: Secondary | ICD-10-CM | POA: Diagnosis not present

## 2017-02-15 DIAGNOSIS — K580 Irritable bowel syndrome with diarrhea: Secondary | ICD-10-CM

## 2017-02-15 HISTORY — DX: Other specified health status: Z78.9

## 2017-02-15 NOTE — Patient Instructions (Signed)
BEFORE YOU LEAVE: -follow up: CPE year -if you wish to do an Osteopathic Treatment session please schedule a 30 minute appointment - very first or very last appointment of the clinic  It was nice to meet you today!  Eat a healthy whole foods diet.  Ensure adequate Vit D.  Get regular weight bearing exercise.  Follow up with Dr. Cyd Silence as planned for the Osteoporosis.  See the gastroenterologist as planned.    WE NOW OFFER   [facility] FAST TRACK!!!  SAME DAY Appointments for ACUTE CARE  Such as: Sprains, Injuries, cuts, abrasions, rashes, muscle pain, joint pain, back pain Colds, flu, sore throats, headache, allergies, cough, fever  Ear pain, sinus and eye infections Abdominal pain, nausea, vomiting, diarrhea, upset stomach Animal/insect bites  3 Easy Ways to Schedule: Walk-In Scheduling Call in scheduling Mychart Sign-up: [email or web address]
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
## 2017-02-16 ENCOUNTER — Ambulatory Visit (INDEPENDENT_AMBULATORY_CARE_PROVIDER_SITE_OTHER): Payer: BLUE CROSS/BLUE SHIELD | Admitting: Gastroenterology

## 2017-02-16 ENCOUNTER — Ambulatory Visit: Payer: BLUE CROSS/BLUE SHIELD | Admitting: Gastroenterology

## 2017-02-16 ENCOUNTER — Encounter: Payer: Self-pay | Admitting: Gastroenterology

## 2017-02-16 VITALS — BP 95/61 | HR 69 | Ht 64.0 in | Wt 137.4 lb

## 2017-02-16 DIAGNOSIS — Z1211 Encounter for screening for malignant neoplasm of colon: Secondary | ICD-10-CM | POA: Diagnosis not present

## 2017-02-16 NOTE — Patient Instructions (Signed)
1. Administer cologaurd kit, if positive perform colonoscopy 2. Try preparation-H, witch hazel pads or anusol cream/suppository during flare up of hemorrhoids  Please call our office to speak with my nurse Driscilla Grammes at [phone] during business hours from 8am to 4pm if you have any questions/concerns. During after hours, you will be redirected to on call GI physician. For any emergency please call 911 or go the nearest emergency room.    Cephas Darby, MD [address]  [place]  [place], [place] [address]  Main: [phone]  Fax: [phone]

## 2017-02-16 NOTE — Progress Notes (Signed)
Cephas Darby, MD [address]  [place]  [place], [place] [address]  Main: [phone]  Fax: [phone] Pager: [phone]   Primary Care Physician: Lucretia Kern, DO  Primary Gastroenterologist:  Dr. Jonathon Bellows   Chief Complaint  Patient presents with  . Hemorrhoids    HPI: Kathy Zuniga is a 61 y.o. female is here for the follow-up of her hemorrhoids. She was referred for diagnostic colonoscopy by Dr. Vicente Males but she could not afford co-pay of $1700. She said she had some stool test done by mail and it came back negative. This was about 6 months ago. She also had flare up of hemorrhoids which resolved with over-the-counter medications. She currently denies any rectal bleeding, constipation, diarrhea, abdominal pain. She reports that her hemorrhoids are currently not active.  Current Outpatient Prescriptions  Medication Sig Dispense Refill  . Cholecalciferol (VITAMIN D3) 5000 units TABS Take 5,000 each by mouth daily.     . Cyanocobalamin (BL VITAMIN B-12 PO) Take by mouth.    . estradiol (CLIMARA - DOSED IN MG/24 HR) 0.025 mg/24hr patch Place 0.025 mg onto the skin every 3 (three) days.    . Glutamine POWD Take by mouth.    Marland Kitchen MAGNESIUM MALATE PO Take by mouth.    . Menaquinone-7 (VITAMIN K2 PO) Take by mouth.    . Nutritional Supplements (DHEA PO) Take by mouth.    . Probiotic Product (PROBIOTIC DAILY PO) Take 1 tablet by mouth daily.    . progesterone (PROMETRIUM) 100 MG capsule Take 200 mg by mouth daily.     Marland Kitchen thyroid (NP THYROID) 30 MG tablet Take 30 mg by mouth daily before breakfast.    . TYMLOS 3120 MCG/1.56ML SOPN   3  . Testosterone POWD 0.25 mg by Does not apply route.     No current facility-administered medications for this visit.     Allergies as of 02/16/2017 - Review Complete 02/16/2017  Allergen Reaction Noted  . Almond meal  05/15/2016  . Asa [aspirin] Other (See Comments) 07/11/2015  . Crab [shellfish allergy]  05/15/2016  . Ibuprofen  Other (See Comments) 07/11/2015  . Spinach  05/15/2016  . Other  05/15/2016    ROS:  General: Negative for anorexia, weight loss, fever, chills, fatigue, weakness. ENT: Negative for hoarseness, difficulty swallowing , nasal congestion. CV: Negative for chest pain, angina, palpitations, dyspnea on exertion, peripheral edema.  Respiratory: Negative for dyspnea at rest, dyspnea on exertion, cough, sputum, wheezing.  GI: See history of present illness. GU:  Negative for dysuria, hematuria, urinary incontinence, urinary frequency, nocturnal urination.  Endo: Negative for unusual weight change.    Physical Examination:   BP 95/61   Pulse 69   Ht '5\' 4"'  (1.626 m)   Wt 62.3 kg (137 lb 6.4 oz)   BMI 23.58 kg/m   General: Well-nourished, well-developed in no acute distress.  Eyes: No icterus. Conjunctivae pink. Mouth: Oropharyngeal mucosa moist and pink , no lesions erythema or exudate. Lungs: Clear to auscultation bilaterally. Non-labored. Heart: Regular rate and rhythm, no murmurs rubs or gallops.  Abdomen: Bowel sounds are normal, nontender, nondistended, no hepatosplenomegaly or masses, no abdominal bruits or hernia , no rebound or guarding.   Extremities: No lower extremity edema. No clubbing or deformities. Neuro: Alert and oriented x 3.  Grossly intact. Skin: Warm and dry, no jaundice.   Psych: Alert and cooperative, normal mood and affect.   Imaging Studies: No results found.  Assessment and Plan:  Kathy Zuniga is a 61 y.o. y/o female Who is overdue for colon cancer screening. She is not willing to undergo colonoscopy because of the co-pay. I discussed with her about alternative tests for colon cancer screening and she decided to go with Cologuard. If this comes back positive, she will need to undergo colonoscopy which will be diagnostic and I am afraid that she she may end up with high co-pay from her insurance company. She understands the risks and benefits of the  above tests.  1. Administer cologaurd kit, if positive perform colonoscopy 2. Try preparation-H, witch hazel pads or anusol cream/suppository during flare up of hemorrhoids  Follow up as needed  Dr Sherri Sear, MD

## 2017-02-24 ENCOUNTER — Ambulatory Visit
Admission: RE | Admit: 2017-02-24 | Payer: BLUE CROSS/BLUE SHIELD | Source: Ambulatory Visit | Admitting: Gastroenterology

## 2017-02-24 ENCOUNTER — Encounter: Admission: RE | Payer: Self-pay | Source: Ambulatory Visit

## 2017-02-24 SURGERY — COLONOSCOPY WITH PROPOFOL
Anesthesia: General

## 2017-03-08 ENCOUNTER — Ambulatory Visit (INDEPENDENT_AMBULATORY_CARE_PROVIDER_SITE_OTHER): Payer: BLUE CROSS/BLUE SHIELD | Admitting: Family Medicine

## 2017-03-08 ENCOUNTER — Encounter: Payer: Self-pay | Admitting: Family Medicine

## 2017-03-08 VITALS — BP 90/58 | HR 75 | Temp 98.6°F | Ht 64.0 in | Wt 140.3 lb

## 2017-03-08 DIAGNOSIS — M9901 Segmental and somatic dysfunction of cervical region: Secondary | ICD-10-CM

## 2017-03-08 DIAGNOSIS — M9903 Segmental and somatic dysfunction of lumbar region: Secondary | ICD-10-CM | POA: Diagnosis not present

## 2017-03-08 DIAGNOSIS — M9905 Segmental and somatic dysfunction of pelvic region: Secondary | ICD-10-CM | POA: Diagnosis not present

## 2017-03-08 DIAGNOSIS — M9900 Segmental and somatic dysfunction of head region: Secondary | ICD-10-CM | POA: Diagnosis not present

## 2017-03-08 DIAGNOSIS — M549 Dorsalgia, unspecified: Secondary | ICD-10-CM

## 2017-03-08 DIAGNOSIS — M5134 Other intervertebral disc degeneration, thoracic region: Secondary | ICD-10-CM | POA: Diagnosis not present

## 2017-03-08 DIAGNOSIS — M810 Age-related osteoporosis without current pathological fracture: Secondary | ICD-10-CM | POA: Diagnosis not present

## 2017-03-08 DIAGNOSIS — G8929 Other chronic pain: Secondary | ICD-10-CM

## 2017-03-08 DIAGNOSIS — M9902 Segmental and somatic dysfunction of thoracic region: Secondary | ICD-10-CM
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
## 2017-03-17 ENCOUNTER — Encounter: Payer: Self-pay | Admitting: Family Medicine

## 2017-07-07 ENCOUNTER — Encounter: Payer: Self-pay | Admitting: Family Medicine

## 2017-08-18 ENCOUNTER — Encounter: Payer: Self-pay | Admitting: Certified Nurse Midwife

## 2017-08-18 ENCOUNTER — Ambulatory Visit: Payer: BLUE CROSS/BLUE SHIELD | Admitting: Certified Nurse Midwife

## 2017-08-18 VITALS — BP 106/64 | HR 68 | Resp 16 | Ht 64.5 in | Wt 146.0 lb

## 2017-08-18 DIAGNOSIS — Z01419 Encounter for gynecological examination (general) (routine) without abnormal findings: Secondary | ICD-10-CM

## 2017-08-18 DIAGNOSIS — N951 Menopausal and female climacteric states: Secondary | ICD-10-CM

## 2017-08-18 DIAGNOSIS — Z79890 Hormone replacement therapy (postmenopausal): Secondary | ICD-10-CM

## 2017-08-18 NOTE — Patient Instructions (Signed)

## 2017-08-18 NOTE — Progress Notes (Signed)
62 y.o. G3P3 Married  Caucasian Fe here for annual exam. Menopausal no HRT. No more hot flashes or night sweats. Seeing  PT for osteoporosis management. Denies vaginal bleeding or vaginal dryness issues. Sees Dr. Maudie Mercury PCP for prn and [facility] for thyroid and hormone therapy once weekly/progesterone/estrogen and Percell Miller and Noemi Chapel for  Osteoporosis. Also has BMD scheduled 7/19. Did Cologard 6 months ago and negative. No health issues today. Has first dentist appointment in 4 years!  No LMP recorded. Patient is postmenopausal.  2014.        Sexually active: No.  The current method of family planning is post menopausal status.    Exercising: Yes.    physical therapy routines, walking Smoker:  no  Health Maintenance: Pap:  08-17-16 neg HPV HR neg History of Abnormal Pap: no MMG:  6/18 bilateral & left breast neg Self Breast exams: yes Colonoscopy:  None  BMD:   2018 TDaP:  2014 Shingles: no Pneumonia: no Hep C and HIV: not done Labs: with [facility]   reports that  has never smoked. she has never used smokeless tobacco. She reports that she drinks about 3.6 oz of alcohol per week. She reports that she does not use drugs.  Past Medical History:  Diagnosis Date  . Arthritis   . Back pain   . Fracture of coccyx ([place])   . Frequent headaches   . Migraines   . No blood products 02/15/2017   Jehovah's Witness  . Osteoporosis   . Thyroid disease     History reviewed. No pertinent surgical history.  Current Outpatient Medications  Medication Sig Dispense Refill  . Cholecalciferol (VITAMIN D3) 5000 units TABS Take 5,000 each by mouth daily.     . Cyanocobalamin (BL VITAMIN B-12 PO) Take by mouth.    . estradiol (CLIMARA - DOSED IN MG/24 HR) 0.025 mg/24hr patch Place 0.025 mg onto the skin once a week.     . Glutamine POWD Take by mouth.    Marland Kitchen MAGNESIUM MALATE PO Take by mouth.    . Menaquinone-7 (VITAMIN K2 PO) Take by mouth.    . Nutritional Supplements (DHEA PO)  Take by mouth.    . Probiotic Product (PROBIOTIC DAILY PO) Take 1 tablet by mouth daily.    . TYMLOS 3120 MCG/1.56ML SOPN   3  . UNABLE TO FIND Progesterone/testerone compounded cream 10mg . 1 gram daily    . thyroid (NP THYROID) 30 MG tablet Take 30 mg by mouth daily before breakfast.     No current facility-administered medications for this visit.     Family History  Problem Relation Age of Onset  . Osteoporosis Mother   . Arthritis Mother   . CVA Mother   . Colon cancer Father   . Heart failure Father   . Arthritis Father   . Cancer - Colon Father     ROS:  Pertinent items are noted in HPI.  Otherwise, a comprehensive ROS was negative.  Exam:   BP 106/64   Pulse 68   Resp 16   Ht 5' 4.5" (1.638 m)   Wt 146 lb (66.2 kg)   BMI 24.67 kg/m  Height: 5' 4.5" (163.8 cm) Ht Readings from Last 3 Encounters:  08/18/17 5' 4.5" (1.638 m)  03/08/17 5\' 4"  (1.626 m)  02/16/17 5\' 4"  (1.626 m)    General appearance: alert, cooperative and appears stated age Head: Normocephalic, without obvious abnormality, atraumatic Neck: no adenopathy, supple, symmetrical, trachea midline and thyroid normal  to inspection and palpation Lungs: clear to auscultation bilaterally Breasts: normal appearance, no masses or tenderness, No nipple retraction or dimpling, No nipple discharge or bleeding, No axillary or supraclavicular adenopathy Heart: regular rate and rhythm Abdomen: soft, non-tender; no masses,  no organomegaly Extremities: extremities normal, atraumatic, no cyanosis or edema Skin: Skin color, texture, turgor normal. No rashes or lesions Lymph nodes: Cervical, supraclavicular, and axillary nodes normal. No abnormal inguinal nodes palpated Neurologic: Grossly normal   Pelvic: External genitalia:  no lesions              Urethra:  normal appearing urethra with no masses, tenderness or lesions              Bartholin's and Skene's: normal                 Vagina: normal appearing vagina with  normal color and discharge, no lesions              Cervix: no cervical motion tenderness, no lesions and normal appearance              Pap taken: No. Bimanual Exam:  Uterus:  normal size, contour, position, consistency, mobility, non-tender              Adnexa: normal adnexa and no mass, fullness, tenderness               Rectovaginal: Confirms               Anus:  normal sphincter tone, no lesions  Chaperone present: yes  A:  Well Woman with normal exam  Menopausal on HRT with [facility]  Hypothyroid and migraines with MD management  Osteoporosis management with Orthopedic  P:   Reviewed health and wellness pertinent to exam  Discussed risks/benefits/warning signs with HRT use and need to advise if occurs and discuss with MD who manages them.  Continue follow up with MD as indicated.  Stressed calcium and Vitamin D importance with Osteoporosis  Pap smear: no   counseled on breast self exam, mammography screening, feminine hygiene, adequate intake of calcium and vitamin D, diet and exercise  return annually or prn  An After Visit Summary was printed and given to the patient.

## 2017-09-05 NOTE — Progress Notes (Signed)
HPI:   Using dictation device. Unfortunately this device frequently misinterprets words/phrases.  Kathy Zuniga is a pleasant 62 yo here for an acute visit for shaking episodes: -it seems these possibly started in childhood  -1-2 episodes per month -seems mainly triggered by anxiety inducing or painful events -seem to resolve with coconut water -can not think of relationship to meals/fasting/etc -has never had LOC with events  ROS: See pertinent positives and negatives per HPI.  Past Medical History:  Diagnosis Date  . Arthritis   . Back pain   . Fracture of coccyx ([place])   . Frequent headaches   . Migraines   . No blood products 02/15/2017   Jehovah's Witness  . Osteoporosis   . Thyroid disease     History reviewed. No pertinent surgical history.  Family History  Problem Relation Age of Onset  . Osteoporosis Mother   . Arthritis Mother   . CVA Mother   . Colon cancer Father   . Heart failure Father   . Arthritis Father   . Cancer - Colon Father     Social History   Socioeconomic History  . Marital status: Married    Spouse name: None  . Number of children: None  . Years of education: None  . Highest education level: None  Social Needs  . Financial resource strain: None  . Food insecurity - worry: None  . Food insecurity - inability: None  . Transportation needs - medical: None  . Transportation needs - non-medical: None  Occupational History  . None  Tobacco Use  . Smoking status: Never Smoker  . Smokeless tobacco: Never Used  Substance and Sexual Activity  . Alcohol use: Yes    Alcohol/week: 3.6 oz    Types: 6 Standard drinks or equivalent per week  . Drug use: No  . Sexual activity: No    Birth control/protection: Post-menopausal  Other Topics Concern  . None  Social History Narrative   Work or School:Retired, used to do [place] with husband      Spiritual Beliefs:Jehovah's Witness      Lifestyle:walking 30 minutes daily and eating a healthy diet, does eat a lot of dairy     Current Outpatient Medications:  .  Cholecalciferol (VITAMIN D3) 5000 units TABS, Take 5,000 each by mouth daily. , Disp: , Rfl:  .  Cyanocobalamin (BL VITAMIN B-12 PO), Take by mouth., Disp: , Rfl:  .  estradiol (CLIMARA - DOSED IN MG/24 HR) 0.025 mg/24hr patch, Place 0.025 mg onto the skin once a week. , Disp: , Rfl:  .  Glutamine POWD, Take by mouth., Disp: , Rfl:  .  MAGNESIUM MALATE PO, Take by mouth., Disp: , Rfl:  .  Menaquinone-7 (VITAMIN K2 PO), Take by mouth., Disp: , Rfl:  .  Nutritional Supplements (DHEA PO), Take by mouth., Disp: , Rfl:  .  Probiotic Product (PROBIOTIC DAILY PO), Take 1 tablet by mouth daily., Disp: , Rfl:  .  thyroid (NP THYROID) 30 MG tablet, Take 30 mg by mouth daily before breakfast., Disp: , Rfl:  .  TYMLOS 3120 MCG/1.56ML SOPN, , Disp: , Rfl: 3 .  UNABLE TO FIND, Progesterone/testerone compounded cream 10mg . 1 gram daily, Disp: , Rfl:   EXAM:  Vitals:   09/06/17 0915  BP: 100/60  Pulse: 70  Temp: 98.2 F (36.8 C)  SpO2: 98%    Body mass index is 24.96 kg/m.  GENERAL: vitals reviewed and listed above,  alert, oriented, appears well hydrated and in no acute distress  HEENT: atraumatic, conjunttiva clear, no obvious abnormalities on inspection of external nose and ears  NECK: no obvious masses on inspection  LUNGS: clear to auscultation bilaterally, no wheezes, rales or rhonchi, good air movement  CV: HRRR, no peripheral edema  MS: moves all extremities without noticeable abnormality  PSYCH: pleasant and cooperative, no obvious depression or anxiety  ASSESSMENT AND PLAN:  Discussed the following assessment and plan:  Shaking  -we discussed possible serious and likely etiologies, workup and treatment, treatment risks and return precautions - several things come to mind including hypoglycemia or panic attacks vs other -we discussed various options  for evaluation and treatment -after this discussion, Kathy Zuniga prefers to check sugar at home during event, monitor timing more closely and do CBT for possible panic attacks - she declines lab eval here for hypoglycemia or medications for anxiety for now -follow up advised if worsening, changes or persists despite treatment Declined patient instructions.  There are no Patient Instructions on file for this visit.  Lucretia Kern, DO

## 2017-09-06 ENCOUNTER — Ambulatory Visit: Payer: BLUE CROSS/BLUE SHIELD | Admitting: Family Medicine

## 2017-09-06 ENCOUNTER — Encounter: Payer: Self-pay | Admitting: Family Medicine

## 2017-09-06 VITALS — BP 100/60 | HR 70 | Temp 98.2°F | Ht 64.5 in | Wt 147.7 lb

## 2017-09-06 DIAGNOSIS — R251 Tremor, unspecified: Secondary | ICD-10-CM | POA: Diagnosis not present

## 2017-09-29 ENCOUNTER — Ambulatory Visit: Payer: Self-pay

## 2017-09-29 ENCOUNTER — Emergency Department
Admission: EM | Admit: 2017-09-29 | Discharge: 2017-09-29 | Disposition: A | Discharge status: Home / Self Care | Payer: BLUE CROSS/BLUE SHIELD | Attending: Emergency Medicine | Admitting: Emergency Medicine

## 2017-09-29 ENCOUNTER — Encounter: Payer: Self-pay | Admitting: Emergency Medicine

## 2017-09-29 DIAGNOSIS — Z79899 Other long term (current) drug therapy: Secondary | ICD-10-CM | POA: Insufficient documentation

## 2017-09-29 DIAGNOSIS — R109 Unspecified abdominal pain: Secondary | ICD-10-CM | POA: Diagnosis present

## 2017-09-29 LAB — URINALYSIS, COMPLETE (UACMP) WITH MICROSCOPIC
Bacteria, UA: NONE SEEN
Bilirubin Urine: NEGATIVE
GLUCOSE, UA: NEGATIVE mg/dL
HGB URINE DIPSTICK: NEGATIVE
Ketones, ur: NEGATIVE mg/dL
Nitrite: NEGATIVE
PH: 6 (ref 5.0–8.0)
PROTEIN: NEGATIVE mg/dL
Specific Gravity, Urine: 1.004 — ABNORMAL LOW (ref 1.005–1.030)

## 2017-09-29 LAB — BASIC METABOLIC PANEL
Anion gap: 5 (ref 5–15)
BUN: 14 mg/dL (ref 6–20)
CHLORIDE: 106 mmol/L (ref 101–111)
CO2: 26 mmol/L (ref 22–32)
Calcium: 9.7 mg/dL (ref 8.9–10.3)
Creatinine, Ser: 0.57 mg/dL (ref 0.44–1.00)
GFR calc Af Amer: >60 mL/min (ref >60)
GLUCOSE: 130 mg/dL — AB (ref 65–99)
POTASSIUM: 4 mmol/L (ref 3.5–5.1)
Sodium: 137 mmol/L (ref 135–145)

## 2017-09-29 LAB — CBC
HEMATOCRIT: 40.4 % (ref 35.0–47.0)
Hemoglobin: 13.6 g/dL (ref 12.0–16.0)
MCH: 32.2 pg (ref 26.0–34.0)
MCHC: 33.6 g/dL (ref 32.0–36.0)
MCV: 95.7 fL (ref 80.0–100.0)
Platelets: 284 10*3/uL (ref 150–440)
RBC: 4.22 MIL/uL (ref 3.80–5.20)
RDW: 13.5 % (ref 11.5–14.5)
WBC: 7.3 10*3/uL (ref 3.6–11.0)

## 2017-09-29 LAB — HEPATIC FUNCTION PANEL
ALK PHOS: 55 U/L (ref 38–126)
ALT: 20 U/L (ref 14–54)
AST: 23 U/L (ref 15–41)
Albumin: 4.2 g/dL (ref 3.5–5.0)
BILIRUBIN DIRECT: 0.1 mg/dL (ref 0.1–0.5)
BILIRUBIN INDIRECT: 0.4 mg/dL (ref 0.3–0.9)
Total Bilirubin: 0.5 mg/dL (ref 0.3–1.2)
Total Protein: 7 g/dL (ref 6.5–8.1)

## 2017-09-29 LAB — LIPASE, BLOOD: Lipase: 34 U/L (ref 11–51)

## 2017-09-29 NOTE — Telephone Encounter (Signed)
Pt calling c/o right flank pain that started this am suddenly. Pt states that pain is severe and intermittent and doubles her over. Pt denies burning with urination but stated her urine is cloudy. Pt thinks it may be a kidney stone. Pt denies any other sx. Pt advised to go to ED per protocol. Pt states she will go and her husband will drive her there.  Reason for Disposition . [1] Sudden onset of severe flank pain AND [2] age > 46  Additional Information . Commented on: All Negative - See Physician Within 4 Hours (or PCP Triage)    Urine is cloudy but no dysuria . Commented on: MODERATE pain (e.g., interferes with normal activities or awakens from sleep)    Pt states pain is severe and intermittent  Answer Assessment - Initial Assessment Questions 1. LOCATION: "Where does it hurt?" (e.g., left, right)     right 2. ONSET: "When did the pain start?"     This am today 3. SEVERITY: "How bad is the pain?" (e.g., Scale 1-10; mild, moderate, or severe)   - MILD (1-3): doesn't interfere with normal activities    - MODERATE (4-7): interferes with normal activities or awakens from sleep    - SEVERE (8-10): excruciating pain and patient unable to do normal activities (stays in bed)       Severe pain  4. PATTERN: "Does the pain come and go, or is it constant?"      Comes and goes 5. CAUSE: "What do you think is causing the pain?"     Kidney stone 6. OTHER SYMPTOMS:  "Do you have any other symptoms?" (e.g., fever, abdominal pain, vomiting, leg weakness, burning with urination, blood in urine)     No fever, abd pain,vomiting,leg weakness,burning with urination,blood in urine. ---- Pt has noted that urine is cloudy 7. PREGNANCY:  "Is there any chance you are pregnant?" "When was your last menstrual period?"     n/a  Protocols used: FLANK PAIN-A-AH

## 2017-09-29 NOTE — Telephone Encounter (Signed)
Monitor for ER arrival 

## 2017-09-29 NOTE — ED Provider Notes (Signed)
[facility] Emergency Department Provider Note   ____________________________________________   I have reviewed the triage vital signs and the nursing notes.   HISTORY  Chief Complaint Flank Pain   History limited by: Not Limited   HPI Kathy Zuniga is a 62 y.o. female who presents to the emergency department today because of concerns for right flank pain.  Patient states that the pain started this morning.  Is located in the right flank.  It did go down the right side of her back slightly.  The pain then went away however returned today after lunch after she ate [place] fries.  She did not have any associated nausea or vomiting.  Has not had any change in defecation or urination today.  States she has had slightly similar pain in the past however never as severe or sharp.  Patient denies any fevers. By the time of my examination the patient's symptoms have resolved.   Per medical record review patient has a history of back pain.  Past Medical History:  Diagnosis Date  . Arthritis   . Back pain   . Fracture of coccyx ([place])   . Frequent headaches   . Migraines   . No blood products 02/15/2017   Jehovah's Witness  . Osteoporosis   . Thyroid disease     Patient Active Problem List   Diagnosis Date Noted  . No blood products 02/15/2017  . Radial styloid tenosynovitis 12/06/2016  . Hemorrhoids 12/06/2016  . Chronic midline thoracic back pain 12/06/2016  . Chronic back pain 06/07/2016  . Irritable bowel syndrome with diarrhea 06/07/2016  . GERD without esophagitis 07/11/2015  . Osteoporosis 07/11/2015    History reviewed. No pertinent surgical history.  Prior to Admission medications   Medication Sig Start Date End Date Taking? Authorizing Provider  Cholecalciferol (VITAMIN D3) 5000 units TABS Take 5,000 each by mouth daily.     [provider]  Cyanocobalamin (BL VITAMIN B-12 PO) Take by mouth.    [provider]  estradiol  (CLIMARA - DOSED IN MG/24 HR) 0.025 mg/24hr patch Place 0.025 mg onto the skin once a week.     [provider]  Glutamine POWD Take by mouth.    [provider]  MAGNESIUM MALATE PO Take by mouth.    [provider]  Menaquinone-7 (VITAMIN K2 PO) Take by mouth.    [provider]  Nutritional Supplements (DHEA PO) Take by mouth.    [provider]  Probiotic Product (PROBIOTIC DAILY PO) Take 1 tablet by mouth daily.    [provider]  thyroid (NP THYROID) 30 MG tablet Take 30 mg by mouth daily before breakfast.    [provider]  [place] Sox 3120 MCG/1.56ML SOPN  02/04/17   [provider]  UNABLE TO FIND Progesterone/testerone compounded cream 10mg . 1 gram daily    [provider]    Allergies Almond meal; Asa [aspirin]; Crab [shellfish allergy]; Ibuprofen; Spinach; Tomato; and Other  Family History  Problem Relation Age of Onset  . Osteoporosis Mother   . Arthritis Mother   . CVA Mother   . Colon cancer Father   . Heart failure Father   . Arthritis Father   . Cancer - Colon Father     Social History Social History   Tobacco Use  . Smoking status: Never Smoker  . Smokeless tobacco: Never Used  Substance Use Topics  . Alcohol use: Yes    Alcohol/week: 3.6 oz    Types:  6 Standard drinks or equivalent per week  . Drug use: No    Review of Systems Constitutional: No fever/chills Eyes: No visual changes. ENT: No sore throat. Cardiovascular: Denies chest pain. Respiratory: Denies shortness of breath. Gastrointestinal: Positive for right back pain. Genitourinary: Negative for dysuria. Musculoskeletal: Negative for back pain. Skin: Negative for rash. Neurological: Negative for headaches, focal weakness or numbness.  ____________________________________________   PHYSICAL EXAM:  VITAL SIGNS: ED Triage Vitals  Enc Vitals Group     BP 09/29/17 1725 (!) 131/58     Pulse Rate 09/29/17 1725 69      Resp 09/29/17 1725 18     Temp 09/29/17 1725 97.8 F (36.6 C)     Temp Source 09/29/17 1725 Oral     SpO2 09/29/17 1725 97 %     Weight 09/29/17 1726 145 lb (65.8 kg)     Height 09/29/17 1726 5\' 5"  (1.651 m)   Constitutional: Alert and oriented. Well appearing and in no distress. Eyes: Conjunctivae are normal.  ENT   Head: Normocephalic and atraumatic.   Nose: No congestion/rhinnorhea.   Mouth/Throat: Mucous membranes are moist.   Neck: No stridor. Hematological/Lymphatic/Immunilogical: No cervical lymphadenopathy. Cardiovascular: Normal rate, regular rhythm.  No murmurs, rubs, or gallops.  Respiratory: Normal respiratory effort without tachypnea nor retractions. Breath sounds are clear and equal bilaterally. No wheezes/rales/rhonchi. Gastrointestinal: Soft and minimally tender in the upper epigastric region. No rebound. No guarding.  Genitourinary: Deferred Musculoskeletal: Normal range of motion in all extremities. No lower extremity edema. Neurologic:  Normal speech and language. No gross focal neurologic deficits are appreciated.  Skin:  Skin is warm, dry and intact. No rash noted. Psychiatric: Mood and affect are normal. Speech and behavior are normal. Patient exhibits appropriate insight and judgment.  ____________________________________________    LABS (pertinent positives/negatives)  CBC wnl BMP wnl except glu 130 UA hgb urine negative, leukocytes trace, rbc and wbc 0-5, none seen bacteria  ____________________________________________   EKG  None  ____________________________________________    RADIOLOGY  None  ____________________________________________   PROCEDURES  Procedures  ____________________________________________   INITIAL IMPRESSION / ASSESSMENT AND PLAN / ED COURSE  Pertinent labs & imaging results that were available during my care of the patient were reviewed by me and considered in my medical decision making (see  chart for details).  Patient presented to the emergency department today because of concerns for right flank pain.  At this time my examination the pain was gone.  Patient did have some mild tenderness to the epigastric region.  The patient blood work without any concerning findings.  Urine without findings concerning for kidney stones or significant infection.  Did discuss possibility gallbladder disease with the patient.  At this point I doubt cholecystitis given lack of current pain or leukocytosis.  Did discuss getting an ultrasound and did offer to perform here in the emergency department however patient declined and stated she would follow-up with her primary care doctor.  We did discuss return precautions.   ____________________________________________   FINAL CLINICAL IMPRESSION(S) / ED DIAGNOSES  Final diagnoses:  Right flank pain     Note: This dictation was prepared with Dragon dictation. Any transcriptional errors that result from this process are unintentional     Nance Pear, MD 09/29/17 2023

## 2017-09-29 NOTE — ED Triage Notes (Signed)
Pt reports intermittent right sided flank pain that started this morning. Pt states when the pain is present it is sharp. Pt denies this pain every happening before. Pt states "I almost left because the pain went away." Pt denies any radiation of pain.

## 2017-09-29 NOTE — Discharge Instructions (Addendum)
Please seek medical attention for any high fevers, chest pain, shortness of breath, change in behavior, persistent vomiting, bloody stool or any other new or concerning symptoms.  

## 2017-09-30 ENCOUNTER — Telehealth: Payer: Self-pay | Admitting: Family Medicine

## 2017-09-30 NOTE — Telephone Encounter (Signed)
Copied from [place]. Topic: Quick Communication - See Telephone Encounter >> Sep 30, 2017  8:45 AM Cleaster Corin, NT wrote: CRM for notification. See Telephone encounter for: 09/30/17.  Pt. Was is in ed yesterday 09/29/17 in the er and was told to sched.. An ultra sound with pcp. Pt would like a callback pt. Can be reached at [phone]

## 2017-09-30 NOTE — Telephone Encounter (Signed)
Pt seen and evaluated in ED.

## 2017-10-02 NOTE — Telephone Encounter (Signed)
Needs minute ER f/u appt.

## 2017-10-03 ENCOUNTER — Telehealth: Payer: Self-pay | Admitting: Family Medicine

## 2017-10-03 NOTE — Telephone Encounter (Signed)
I scheduled the pt for an appt on 4/11 at 3pm.

## 2017-10-03 NOTE — Telephone Encounter (Signed)
I called the pt and scheduled an appt for 4/11 at 3pm.

## 2017-10-03 NOTE — Telephone Encounter (Signed)
Can you ask Dr. Maudie Mercury when is the best time to schedule this?

## 2017-10-06 ENCOUNTER — Ambulatory Visit (INDEPENDENT_AMBULATORY_CARE_PROVIDER_SITE_OTHER): Payer: BLUE CROSS/BLUE SHIELD | Admitting: Family Medicine

## 2017-10-06 ENCOUNTER — Encounter: Payer: Self-pay | Admitting: Family Medicine

## 2017-10-06 VITALS — BP 102/60 | HR 68 | Temp 97.9°F | Ht 65.0 in | Wt 143.3 lb

## 2017-10-06 DIAGNOSIS — Z1211 Encounter for screening for malignant neoplasm of colon: Secondary | ICD-10-CM

## 2017-10-06 DIAGNOSIS — R1011 Right upper quadrant pain: Secondary | ICD-10-CM

## 2017-10-06 NOTE — Progress Notes (Signed)
HPI:  Using dictation device. Unfortunately this device frequently misinterprets words/phrases.  Acute visit for R flank pain: -seen in ER 4/4 -per ER notes review, benign exam, labs unrevealing; per notes they offered [place] to eval GB dz, but pt declined and wanted to follow up here -reports: no symptoms since, reports it was a brief episode of crampy severe RUQ/flank pain after eating that resolved on its own -denies:abd pain since, nausea, vomiting, diarrhea, bowel changes  Has never done colon ca screening. Wants to do cologuard.  ROS: See pertinent positives and negatives per HPI.  Past Medical History:  Diagnosis Date  . Arthritis   . Back pain   . Fracture of coccyx (Kathy Zuniga)   . Frequent headaches   . Migraines   . No blood products 02/15/2017   Jehovah's Witness  . Osteoporosis   . Thyroid disease     History reviewed. No pertinent surgical history.  Family History  Problem Relation Age of Onset  . Osteoporosis Mother   . Arthritis Mother   . CVA Mother   . Colon cancer Father   . Heart failure Father   . Arthritis Father   . Cancer - Colon Father     SOCIAL HX: see hpi   Current Outpatient Medications:  .  Cholecalciferol (VITAMIN D3) 5000 units TABS, Take 5,000 each by mouth daily. , Disp: , Rfl:  .  Cyanocobalamin (BL VITAMIN B-12 PO), Take by mouth., Disp: , Rfl:  .  estradiol (CLIMARA - DOSED IN MG/24 HR) 0.025 mg/24hr patch, Place 0.025 mg onto the skin once a week. , Disp: , Rfl:  .  Glutamine POWD, Take by mouth., Disp: , Rfl:  .  MAGNESIUM MALATE PO, Take by mouth., Disp: , Rfl:  .  Menaquinone-7 (VITAMIN K2 PO), Take by mouth., Disp: , Rfl:  .  NON FORMULARY, Progesterone 67m cream, Disp: , Rfl:  .  NON FORMULARY, Testosterone 0.262mcream, Disp: , Rfl:  .  Nutritional Supplements (DHEA PO), Take by mouth., Disp: , Rfl:  .  Probiotic Product (PROBIOTIC DAILY PO), Take 1 tablet by mouth daily., Disp: , Rfl:  .  thyroid (NP THYROID) 30 MG tablet, Take  30 mg by mouth daily before breakfast., Disp: , Rfl:  .  TYMLOS 3120 MCG/1.56ML SOPN, , Disp: , Rfl: 3  EXAM:  Vitals:   10/06/17 1449  BP: 102/60  Pulse: 68  Temp: 97.9 F (36.6 C)    Body mass index is 23.85 kg/m.  GENERAL: vitals reviewed and listed above, alert, oriented, appears well hydrated and in no acute distress  HEENT: atraumatic, conjunttiva clear, no obvious abnormalities on inspection of external nose and ears  NECK: no obvious masses on inspection  LUNGS: clear to auscultation bilaterally, no wheezes, rales or rhonchi, good air movement  CV: HRRR, no peripheral edema  ABD: BS+, soft, NTTP  MS: moves all extremities without noticeable abnormality  PSYCH: pleasant and cooperative, no obvious depression or anxiety  ASSESSMENT AND PLAN:  Discussed the following assessment and plan: More than 50% of over 25  minutes spent in total in caring for this patient was spent face-to-face with the patient, counseling and/or coordinating care.   RUQ pain - Plan: USKoreabdomen Limited RUQ -we discussed possible serious and likely etiologies, workup and treatment, treatment risks and return precautions - query GB, small passed kidney stone vs musculoskeletal most likely vs other -after this discussion, ReJameriapted for usKoreaUQ -follow up advised as needed if recurs -of  course, we advised Kathy Zuniga  to return or notify a doctor immediately if symptoms worsen or persist or new concerns arise.  Colon cancer screening -advised to do colon cancer screening and rationale -she prefers to do cologuard test, seems she wishes to do even if not covered -advised assistant to order   -Patient advised to return or notify a doctor immediately if symptoms worsen or persist or new concerns arise.  Patient Instructions  BEFORE YOU LEAVE: -order cologuard test and give her brochure -follow up: CPE in 3-4 months if not done in last year, cancel other appts if any  -We placed a referral  for you as discussed for the ultrasound to look at the gall bladder. It usually takes about 1-2 weeks to process and schedule this referral. If you have not heard from [place] regarding this appointment in 2 weeks please contact our office.  We ordered the Cologuard test for colon cancer screening. Please complete this test promptly once the kit arrives. Check with your insurance on coverage. Please contact us if you have not received your kit in the next few weeks.      Kathy Kern, DO

## 2017-10-06 NOTE — Patient Instructions (Signed)
BEFORE YOU LEAVE: -order cologuard test and give her brochure -follow up: CPE in 3-4 months if not done in last year, cancel other appts if any  -We placed a referral for you as discussed for the ultrasound to look at the gall bladder. It usually takes about 1-2 weeks to process and schedule this referral. If you have not heard from [place] regarding this appointment in 2 weeks please contact our office.  We ordered the Cologuard test for colon cancer screening. Please complete this test promptly once the kit arrives. Check with your insurance on coverage. Please contact us if you have not received your kit in the next few weeks.

## 2017-10-10 ENCOUNTER — Telehealth: Payer: Self-pay | Admitting: Family Medicine

## 2017-10-10 NOTE — Telephone Encounter (Signed)
Copied from [place] [phone]. Topic: Quick Communication - See Telephone Encounter >> Oct 10, 2017  2:35 PM Vernona Rieger wrote: CRM for notification. See Telephone encounter for: 10/10/17.  Patient said she has an ultra sound scheduled with [facility] on 4/22. She said that it is not in her network or her insurance. She said she is canceling it. She has a few places that work better for her insurance and they are [place], [facility] [place].   Please advise [phone] she needs this done before 4/26 as she will be out of town.

## 2017-10-17 ENCOUNTER — Other Ambulatory Visit: Payer: Self-pay

## 2017-12-06 ENCOUNTER — Encounter: Payer: Self-pay | Admitting: Family Medicine

## 2017-12-08 ENCOUNTER — Encounter: Payer: Self-pay | Admitting: Family Medicine

## 2017-12-14 LAB — COLOGUARD: COLOGUARD: NEGATIVE

## 2017-12-19 ENCOUNTER — Ambulatory Visit
Admission: RE | Admit: 2017-12-19 | Discharge: 2017-12-19 | Disposition: A | Discharge status: Home / Self Care | Payer: BLUE CROSS/BLUE SHIELD | Source: Ambulatory Visit | Attending: Family Medicine | Admitting: Family Medicine

## 2017-12-19 DIAGNOSIS — R1011 Right upper quadrant pain: Secondary | ICD-10-CM

## 2017-12-28 LAB — HM DEXA SCAN

## 2018-01-02 ENCOUNTER — Encounter: Payer: Self-pay | Admitting: Family Medicine

## 2018-01-09 ENCOUNTER — Encounter: Payer: Self-pay | Admitting: Family Medicine

## 2018-02-02 ENCOUNTER — Ambulatory Visit (INDEPENDENT_AMBULATORY_CARE_PROVIDER_SITE_OTHER): Payer: BLUE CROSS/BLUE SHIELD | Admitting: Family Medicine

## 2018-02-02 ENCOUNTER — Encounter: Payer: Self-pay | Admitting: Family Medicine

## 2018-02-02 VITALS — BP 100/68 | HR 64 | Temp 97.9°F | Ht 64.25 in | Wt 143.7 lb

## 2018-02-02 DIAGNOSIS — Z0000 Encounter for general adult medical examination without abnormal findings: Secondary | ICD-10-CM

## 2018-02-02 DIAGNOSIS — Z114 Encounter for screening for human immunodeficiency virus [HIV]: Secondary | ICD-10-CM | POA: Diagnosis not present

## 2018-02-02 DIAGNOSIS — Z9189 Other specified personal risk factors, not elsewhere classified: Secondary | ICD-10-CM

## 2018-02-02 DIAGNOSIS — Z1331 Encounter for screening for depression: Secondary | ICD-10-CM | POA: Diagnosis not present

## 2018-02-02 DIAGNOSIS — Z1159 Encounter for screening for other viral diseases: Secondary | ICD-10-CM | POA: Diagnosis not present
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
## 2018-02-02 NOTE — Patient Instructions (Signed)
BEFORE YOU LEAVE: -labs -follow up: yearly for physical and as needed  We have ordered labs or studies at this visit. It can take up to 1-2 weeks for results and processing. IF results require follow up or explanation, we will call you with instructions. Clinically stable results will be released to your [facility]. If you have not heard from [place] or cannot find your results in [facility] in 2 weeks please contact our office at [phone].  If you are not yet signed up for [facility], please consider signing up.   Preventive Care 40-64 Years, Female Preventive care refers to lifestyle choices and visits with your health care provider that can promote health and wellness. What does preventive care include?  A yearly physical exam. This is also called an annual well check.  Dental exams once or twice a year.  Routine eye exams. Ask your health care provider how often you should have your eyes checked.  Personal lifestyle choices, including: ? Daily care of your teeth and gums. ? Regular physical activity. ? Eating a healthy diet. ? Manage stress with counseling or relaxation/meditation ? Avoiding tobacco and drug use. ? Limiting alcohol use. ? Practicing safe sex. ? Taking vitamin and mineral supplements as recommended by your health care provider. What happens during an annual well check? The services and screenings done by your health care provider during your annual well check will depend on your age, overall health, lifestyle risk factors, and family history of disease. Counseling Your health care provider may ask you questions about your:  Alcohol use.  Tobacco use.  Drug use.  Emotional well-being.  Home and relationship well-being.  Sexual activity.  Eating habits.  Work and work Statistician.  Method of birth control.  Menstrual cycle.  Pregnancy history.  Screening You may have the following tests or measurements:  Height, weight, and BMI.  Blood  pressure.  Lipid and cholesterol levels. These may be checked every 5 years, or more frequently if you are over 49 years old.  Skin check.  Lung cancer screening. You may have this screening every year starting at age 87 if you have a 30-pack-year history of smoking and currently smoke or have quit within the past 15 years.  Fecal occult blood test (FOBT) of the stool. You may have this test every year starting at age 41.  Flexible sigmoidoscopy or colonoscopy. You may have a sigmoidoscopy every 5 years or a colonoscopy every 10 years starting at age 1.  Hepatitis C blood test.  Hepatitis B blood test.  Sexually transmitted disease (STD) testing.  Diabetes screening. This is done by checking your blood sugar (glucose) after you have not eaten for a while (fasting). You may have this done every 1-3 years.  Mammogram. This may be done every 1-2 years. Talk to your health care provider about when you should start having regular mammograms. This may depend on whether you have a family history of breast cancer.  BRCA-related cancer screening. This may be done if you have a family history of breast, ovarian, tubal, or peritoneal cancers.  Pelvic exam and Pap test. This may be done every 3 years starting at age 65. Starting at age 37, this may be done every 5 years if you have a Pap test in combination with an HPV test.  Bone density scan. This is done to screen for osteoporosis. You may have this scan if you are at high risk for osteoporosis.  Discuss your test results, treatment options, and if  necessary, the need for more tests with your health care provider. Vaccines Your health care provider may recommend certain vaccines, such as:  Influenza vaccine. This is recommended every year.  Tetanus, diphtheria, and acellular pertussis (Tdap, Td) vaccine. You may need a Td booster every 10 years.  Varicella vaccine. You may need this if you have not been vaccinated.  Zoster vaccine. You  may need this after age 49.  Measles, mumps, and rubella (MMR) vaccine. You may need at least one dose of MMR if you were born in 1957 or later. You may also need a second dose.  Pneumococcal 13-valent conjugate (PCV13) vaccine. You may need this if you have certain conditions and were not previously vaccinated.  Pneumococcal polysaccharide (PPSV23) vaccine. You may need one or two doses if you smoke cigarettes or if you have certain conditions.  Meningococcal vaccine. You may need this if you have certain conditions.  Hepatitis A vaccine. You may need this if you have certain conditions or if you travel or work in places where you may be exposed to hepatitis A.  Hepatitis B vaccine. You may need this if you have certain conditions or if you travel or work in places where you may be exposed to hepatitis B.  Haemophilus influenzae type b (Hib) vaccine. You may need this if you have certain conditions.  Talk to your health care provider about which screenings and vaccines you need and how often you need them. This information is not intended to replace advice given to you by your health care provider. Make sure you discuss any questions you have with your health care provider. Document Released: 07/11/2015 Document Revised: 03/03/2016 Document Reviewed: 04/15/2015 Elsevier Interactive Patient Education  [employer].

## 2018-02-03 LAB — HEPATITIS C ANTIBODY
HEP C AB: NONREACTIVE
SIGNAL TO CUT-OFF: 0.02 (ref <1.00)

## 2018-02-03 LAB — HIV ANTIBODY (ROUTINE TESTING W REFLEX): HIV 1&2 Ab, 4th Generation: NONREACTIVE

## 2018-06-26 ENCOUNTER — Ambulatory Visit (INDEPENDENT_AMBULATORY_CARE_PROVIDER_SITE_OTHER): Payer: BLUE CROSS/BLUE SHIELD | Admitting: Family Medicine

## 2018-06-26 ENCOUNTER — Encounter: Payer: Self-pay | Admitting: Family Medicine

## 2018-06-26 VITALS — BP 100/76 | HR 62 | Temp 97.9°F | Ht 64.25 in | Wt 146.6 lb

## 2018-06-26 DIAGNOSIS — R0781 Pleurodynia: Secondary | ICD-10-CM | POA: Diagnosis not present

## 2018-06-26 NOTE — Progress Notes (Signed)
HPI:  Using dictation device. Unfortunately this device frequently misinterprets words/phrases.  Acute visit for L side pain: -was doing new abd workout last week and then developed pain in L lower rib area -this is sharp with certain movements, better with heat and starting to feel better today -she was concerned she may have broken a rib -no sib, dyspnea, abd pain, fever, hemoptysis or sputum production  ROS: See pertinent positives and negatives per HPI.  Past Medical History:  Diagnosis Date  . Arthritis   . Back pain   . Fracture of coccyx ([place])   . Frequent headaches   . Migraines   . No blood products 02/15/2017   Jehovah's Witness  . Osteoporosis   . Thyroid disease     History reviewed. No pertinent surgical history.  Family History  Problem Relation Age of Onset  . Osteoporosis Mother   . Arthritis Mother   . CVA Mother   . Colon cancer Father   . Heart failure Father   . Arthritis Father   . Cancer - Colon Father     SOCIAL HX: see hpi   Current Outpatient Medications:  .  Cholecalciferol (VITAMIN D3) 5000 units TABS, Take 5,000 each by mouth daily. , Disp: , Rfl:  .  Cyanocobalamin (BL VITAMIN B-12 PO), Take by mouth., Disp: , Rfl:  .  estradiol (CLIMARA - DOSED IN MG/24 HR) 0.025 mg/24hr patch, Place 0.025 mg onto the skin once a week. , Disp: , Rfl:  .  Glutamine POWD, Take by mouth., Disp: , Rfl:  .  MAGNESIUM MALATE PO, Take by mouth., Disp: , Rfl:  .  Menaquinone-7 (VITAMIN K2 PO), Take by mouth., Disp: , Rfl:  .  NON FORMULARY, Progesterone 80mg  cream, Disp: , Rfl:  .  NON FORMULARY, Testosterone 0.25mg  cream, Disp: , Rfl:  .  Nutritional Supplements (DHEA PO), Take by mouth., Disp: , Rfl:  .  OVER THE COUNTER MEDICATION, Calcium magnesium citrate 300mg  daily, Disp: , Rfl:  .  Probiotic Product (PROBIOTIC DAILY PO), Take 1 tablet by mouth daily., Disp: , Rfl:  .  thyroid (NP THYROID) 30 MG tablet, Take 30 mg by mouth daily before breakfast.,  Disp: , Rfl:   EXAM:  Vitals:   06/26/18 1021  BP: 100/76  Pulse: 62  Temp: 97.9 F (36.6 C)  SpO2: 97%    Body mass index is 24.97 kg/m.  GENERAL: vitals reviewed and listed above, alert, oriented, appears well hydrated and in no acute distress  HEENT: atraumatic, conjunttiva clear, no obvious abnormalities on inspection of external nose and ears  NECK: no obvious masses on inspection  LUNGS: clear to auscultation bilaterally, no wheezes, rales or rhonchi, good air movement  CV: HRRR, no peripheral edema  MS: moves all extremities without noticeable abnormality, ttp focal L lower lat rib cage in soft tissues - reproducible  ABD: soft, NTTP  PSYCH: pleasant and cooperative, no obvious depression or anxiety  ASSESSMENT AND PLAN:  Discussed the following assessment and plan:  Rib pain  -we discussed possible serious and likely etiologies, workup and treatment, treatment risks and return precautions - suspect soft tissue strain or sprain -after this discussion, Kathy Zuniga opted for observation given improving, offered imaging but opted to hold off -of course, we advised Kathy Zuniga  to return or notify a doctor immediately if symptoms worsen or persist or new concerns arise.  She report this will be her last visit here as insurance is changing and we are not in  network. She is sad about this. She may return once has medicare in a few years if we are in network then. Will remove from pt list in 30 days. She of course is welcome to follow up her e- but reports will not due to the insurance issue.  There are no Patient Instructions on file for this visit.  Kathy Kern, DO

## 2018-07-05 ENCOUNTER — Encounter: Payer: Self-pay | Admitting: Family Medicine

## 2018-07-05 ENCOUNTER — Ambulatory Visit (INDEPENDENT_AMBULATORY_CARE_PROVIDER_SITE_OTHER): Payer: BLUE CROSS/BLUE SHIELD | Admitting: Family Medicine

## 2018-07-05 VITALS — BP 110/70 | HR 89 | Temp 98.8°F | Resp 14 | Ht 64.0 in | Wt 147.9 lb

## 2018-07-05 DIAGNOSIS — M810 Age-related osteoporosis without current pathological fracture: Secondary | ICD-10-CM

## 2018-07-05 DIAGNOSIS — E663 Overweight: Secondary | ICD-10-CM | POA: Diagnosis not present

## 2018-07-05 DIAGNOSIS — Z531 Procedure and treatment not carried out because of patient's decision for reasons of belief and group pressure: Secondary | ICD-10-CM | POA: Insufficient documentation

## 2018-07-05 DIAGNOSIS — Z8719 Personal history of other diseases of the digestive system: Secondary | ICD-10-CM | POA: Diagnosis not present

## 2018-07-05 DIAGNOSIS — IMO0001 Reserved for inherently not codable concepts without codable children: Secondary | ICD-10-CM

## 2018-07-05 DIAGNOSIS — J069 Acute upper respiratory infection, unspecified: Secondary | ICD-10-CM | POA: Diagnosis not present

## 2018-07-05 NOTE — Progress Notes (Signed)
Name: Kathy Zuniga   MRN: [id number]    DOB: 1955-10-12   Date:07/05/2018       Progress Note  Subjective  Chief Complaint  Chief Complaint  Patient presents with  . Establish Care  . Hormone Therapy    HPI  Social: She is from [place], she moved here to be closer to her daughter about 5 years ago. 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    HRT: She had been going to [facility] in [place], but her insurance changed and they are no longer covered. Has osteoporosis as well.  She does have appointment with [facility] for management on 07/10/2017.  Osteoporosis: She was taking daily injection for this via [facility] in [place]. She has been off of this medication now for several months.  She has done PT in the past, still doing exercises 3 days a week and tries to walk 1-2 miles daily (she has been sick lately).  Overweight: Exercising as above. She eats healthy most of the time; lately she indulged over the holidays.   Nasal congestion/URI: Congested cough, nasal congestion, and rhinorrhea for several days.  Rectal Bleeding: She has history of intermittent rectal bleeding. Last month she took aspirin because she was having pain and had a bad cold, she noticed BRB on tissue paper about 1 time a day for 5 days.  Since then she has not noticed any issues. Father had colon cancer in his 3's. She has never had colonoscopy - did do cologuard in 2019 and it was negative.  HM: Declines flu shot.    Patient Active Problem List   Diagnosis Date Noted  . Refusal of blood transfusions as patient is Jehovah's Witness 07/05/2018  . No blood products 02/15/2017  . Radial styloid tenosynovitis 12/06/2016  . Hemorrhoids 12/06/2016  . Chronic midline thoracic back pain 12/06/2016  . Chronic back pain 06/07/2016  . Irritable bowel syndrome with diarrhea 06/07/2016  . GERD  without esophagitis 07/11/2015  . Osteoporosis 07/11/2015    Past Surgical History:  Procedure Laterality Date  . WISDOM TOOTH EXTRACTION      Family History  Problem Relation Age of Onset  . Osteoporosis Mother        Mom passed away from pathological fractures  . Arthritis Mother   . CVA Mother        Margretta Sidle  . Colon cancer Father   . Heart failure Father   . Arthritis Father   . Drug abuse Brother   . Heart disease Paternal Grandfather   . Hypothyroidism Daughter   . Diabetes Mellitus II Daughter   . Diabetes Mellitus II Son     Social History   Socioeconomic History  . Marital status: Married    Spouse name: [name]  . Number of children: 3  . Years of education: Not on file  . Highest education level: Not on file  Occupational History  . Occupation: unemployed  Social Needs  . Financial resource strain: Not on file  . Food insecurity:    Worry: Never true    Inability: Never true  . Transportation needs:    Medical: No    Non-medical: No  Tobacco Use  . Smoking status: Never Smoker  . Smokeless tobacco: Never Used  Substance and Sexual Activity  . Alcohol use: Yes    Alcohol/week: 6.0 standard drinks    Types: 6 Standard drinks  or equivalent per week  . Drug use: No  . Sexual activity: Never    Birth control/protection: Post-menopausal  Lifestyle  . Physical activity:    Days per week: 3 days    Minutes per session: 60 min  . Stress: Not at all  Relationships  . Social connections:    Talks on phone: More than three times a week    Gets together: More than three times a week    Attends religious service: More than 4 times per year    Active member of club or organization: No    Attends meetings of clubs or organizations: Never    Relationship status: Married  . Intimate partner violence:    Fear of current or ex partner: No    Emotionally abused: No    Physically abused: No    Forced sexual activity: No  Other Topics Concern  . Not on file    Social History Narrative   Work or School:Retired, used to do [place] with husband      Spiritual Beliefs:Jehovah's Witness      Lifestyle:walking 60 minutes daily and eating a healthy diet, does eat a lot of dairy     Current Outpatient Medications:  .  Cholecalciferol (VITAMIN D3) 5000 units TABS, Take 5,000 each by mouth daily. , Disp: , Rfl:  .  Cyanocobalamin (BL VITAMIN B-12 PO), Take by mouth., Disp: , Rfl:  .  estradiol (CLIMARA - DOSED IN MG/24 HR) 0.025 mg/24hr patch, Place 0.025 mg onto the skin once a week. , Disp: , Rfl:  .  Glutamine POWD, Take by mouth., Disp: , Rfl:  .  MAGNESIUM MALATE PO, Take by mouth., Disp: , Rfl:  .  Menaquinone-7 (VITAMIN K2 PO), Take by mouth., Disp: , Rfl:  .  NON FORMULARY, Progesterone 80mg  cream, Disp: , Rfl:  .  NON FORMULARY, Testosterone 0.25mg  cream, Disp: , Rfl:  .  Nutritional Supplements (DHEA PO), Take by mouth., Disp: , Rfl:  .  OVER THE COUNTER MEDICATION, Calcium magnesium citrate 300mg  daily, Disp: , Rfl:  .  Probiotic Product (PROBIOTIC DAILY PO), Take 1 tablet by mouth daily., Disp: , Rfl:  .  thyroid (NP THYROID) 30 MG tablet, Take 30 mg by mouth daily before breakfast., Disp: , Rfl:   Allergies  Allergen Reactions  . Almond Meal   . Asa [Aspirin] Other (See Comments)    GI bleed Gi bleed   . Crab [Shellfish Allergy]     clams  . Ibuprofen Other (See Comments)    GI bleed  . Spinach   . Tomato Other (See Comments)  . Other     Green Beans, zucchini, squash    I personally reviewed active problem list, medication list, allergies, family history, social history, health maintenance, notes from last encounter, lab results with the patient/caregiver today.   ROS Constitutional: Negative for fever or weight change.  Respiratory: Negative for cough and shortness of breath.   Cardiovascular: Negative for chest pain or palpitations.  Gastrointestinal: Negative for abdominal pain, no  bowel changes.  Musculoskeletal: Negative for gait problem or joint swelling.  Skin: Negative for rash.  Neurological: Negative for dizziness or headache.  No other specific complaints in a complete review of systems (except as listed in HPI above).  Objective  Vitals:   07/05/18 0918  BP: 110/70  Pulse: 89  Resp: 14  Temp: 98.8 F (37.1 C)  SpO2: 96%  Weight: 147  lb 14.4 oz (67.1 kg)  Height: 5\' 4"  (1.626 m)    Body mass index is 25.39 kg/m.  Physical Exam  Constitutional: Patient appears well-developed and well-nourished. No distress.  HENT: Head: Normocephalic and atraumatic. Ears: bilateral TMs with no erythema or effusion; Nose: Nose normal. Mouth/Throat: Oropharynx is clear and moist. No oropharyngeal exudate or tonsillar swelling. Mild lymphadenopathy to bilateral submandibular chains. Eyes: Conjunctivae and EOM are normal. No scleral icterus.  Pupils are equal, round, and reactive to light.  Neck: Normal range of motion. Neck supple. No JVD present. No thyromegaly present.  Cardiovascular: Normal rate, regular rhythm and normal heart sounds.  No murmur heard. No BLE edema. Pulmonary/Chest: Effort normal and breath sounds normal. No respiratory distress. Musculoskeletal: Normal range of motion, no joint effusions. No gross deformities Neurological: Pt is alert and oriented to person, place, and time. No cranial nerve deficit. Coordination, balance, strength, speech and gait are normal.  Skin: Skin is warm and dry. No rash noted. No erythema.  Psychiatric: Patient has a normal mood and affect. behavior is normal. Judgment and thought content normal.  No results found for this or any previous visit (from the past 72 hour(s)).  PHQ2/9: Depression screen [facility] 2/9 07/05/2018 02/02/2018 06/07/2016  Decreased Interest 0 0 0  Down, Depressed, Hopeless 0 0 0  PHQ - 2 Score 0 0 0  Altered sleeping 0 - -  Tired, decreased energy 0 - -  Change in appetite 0 - -  Feeling bad or  failure about yourself  0 - -  Trouble concentrating 0 - -  Moving slowly or fidgety/restless 0 - -  Suicidal thoughts 0 - -  PHQ-9 Score 0 - -  Difficult doing work/chores Not difficult at all - -   Fall Risk: Fall Risk  07/05/2018 06/07/2016  Falls in the past year? 0 No  Number falls in past yr: 0 -  Injury with Fall? 0 -   Assessment & Plan  1. History of rectal bleeding -Lengthy discussion regarding rectal bleeding, risk and benefit of colonoscopy, and my personal recommendation that she return to GI with the intention of obtaining colonoscopy screening.  I did advise that Cologuard though effective in screening for colorectal cancer and average risk adults, is not an effective/appropriate screening tool for her as she has a father with history of colorectal cancer, and current symptoms of rectal bleeding. - Ambulatory referral to Gastroenterology  2. Age-related osteoporosis without current pathological fracture - Continue follow up with Orth[facility])  3. Overweight (BMI 25.0-29.9) - Increase exercise as able, continue healthy lifestyle/food choices.  4. Viral upper respiratory tract infection - No bacterial infection concern today, will return if not improving in 5-7 days  5. Refusal of blood transfusions as patient is Jehovah's Witness - Noted on chart.

## 2018-07-06 ENCOUNTER — Other Ambulatory Visit: Payer: Self-pay

## 2018-07-06 DIAGNOSIS — Z8719 Personal history of other diseases of the digestive system: Secondary | ICD-10-CM

## 2018-07-06 DIAGNOSIS — K625 Hemorrhage of anus and rectum: Secondary | ICD-10-CM

## 2018-07-10 ENCOUNTER — Encounter: Payer: Self-pay | Admitting: Obstetrics and Gynecology

## 2018-07-10 ENCOUNTER — Ambulatory Visit: Payer: BLUE CROSS/BLUE SHIELD | Admitting: Obstetrics and Gynecology

## 2018-07-10 ENCOUNTER — Ambulatory Visit (INDEPENDENT_AMBULATORY_CARE_PROVIDER_SITE_OTHER): Payer: BLUE CROSS/BLUE SHIELD | Admitting: Obstetrics and Gynecology

## 2018-07-10 VITALS — BP 130/86 | HR 66 | Ht 64.5 in | Wt 149.0 lb

## 2018-07-10 DIAGNOSIS — Z79890 Hormone replacement therapy (postmenopausal): Secondary | ICD-10-CM | POA: Diagnosis not present

## 2018-07-10 DIAGNOSIS — E039 Hypothyroidism, unspecified: Secondary | ICD-10-CM | POA: Diagnosis not present

## 2018-07-10 DIAGNOSIS — M818 Other osteoporosis without current pathological fracture: Secondary | ICD-10-CM

## 2018-07-10 NOTE — Patient Instructions (Signed)
I value your feedback and entrusting us with your care. If you get a  patient survey, I would appreciate you taking the time to let us know about your experience today. Thank you! 

## 2018-07-10 NOTE — Progress Notes (Addendum)
Hubbard Hartshorn, FNP   Chief Complaint  Patient presents with  . HRT    HPI:      [name] is a 63 y.o. G3P3 who LMP was No LMP recorded. Patient is postmenopausal., presents today for NP consultation re: HRT. Pt has been seeing [facility] in [place], but they no longer take her insurance. Pt has her annual 2/20 with a GYN in [place], but they also no longer take her insurance, so she is transferring care her.  She is on multiple HRT meds by previous practice (med records reviewed). She uses estradiol 0.25 mg patch twice wkly, progesterone 12% crm nightly, testosterone crm 1% crm nightly, and takes DHEA 5 mg a couple times a wk. She states she gets scalp lesions if she takes it daily. She denies any vasomotor sx, vaginal dryness, breast tenderness, sleep issues on HRT. Most meds and doses managed based on Q6 mo labs and not sx. Pt also on armour thyroid due to high normal TSH values, but no sx. Pt started HRT due to body pain from osteoporosis and pt feels better. She is interested/willing to come off/decrease meds. Gets Rx at [facility] and works with Ashby Dawes, [place] there. Pt doesn't need Rx RF currently.  Pt is current on annual (due 2/20) and pap, but is past due for mammo. She had one 2 yrs ago but doesn't want them yearly. Is willing to do one this yr after her annual.  She is not sex active due to husband's medical issues, but denies any libido/vag dryness issues.  Has osteoporosis. Tried tymlos injections and had improvement on 2019 DEXA. Was doing this with [facility].  No FH breast/ovar/colon cancer.  Has colonoscopy sched 1/20 but again may be issue with insurance.   Past Medical History:  Diagnosis Date  . Arthritis   . Back pain   . Fracture of coccyx ([place])   . Frequent headaches   . Migraines   . No blood products 02/15/2017   Jehovah's Witness  . Osteoporosis   . Thyroid disease     Past Surgical History:  Procedure  Laterality Date  . WISDOM TOOTH EXTRACTION      Family History  Problem Relation Age of Onset  . Osteoporosis Mother        Mom passed away from pathological fractures  . Arthritis Mother   . CVA Mother        Margretta Sidle  . Colon cancer Father   . Heart failure Father   . Arthritis Father   . Drug abuse Brother   . Heart disease Paternal Grandfather   . Hypothyroidism Daughter   . Diabetes Mellitus II Daughter   . Diabetes Mellitus II Son     Social History   Socioeconomic History  . Marital status: Married    Spouse name: [name]  . Number of children: 3  . Years of education: Not on file  . Highest education level: Not on file  Occupational History  . Occupation: unemployed  Social Needs  . Financial resource strain: Not on file  . Food insecurity:    Worry: Never true    Inability: Never true  . Transportation needs:    Medical: No    Non-medical: No  Tobacco Use  . Smoking status: Never Smoker  . Smokeless tobacco: Never Used  Substance and Sexual Activity  . Alcohol use: Yes    Alcohol/week: 6.0 standard drinks    Types: 6  Standard drinks or equivalent per week  . Drug use: No  . Sexual activity: Not Currently    Birth control/protection: Post-menopausal  Lifestyle  . Physical activity:    Days per week: 3 days    Minutes per session: 60 min  . Stress: Not at all  Relationships  . Social connections:    Talks on phone: More than three times a week    Gets together: More than three times a week    Attends religious service: More than 4 times per year    Active member of club or organization: No    Attends meetings of clubs or organizations: Never    Relationship status: Married  . Intimate partner violence:    Fear of current or ex partner: No    Emotionally abused: No    Physically abused: No    Forced sexual activity: No  Other Topics Concern  . Not on file  Social History Narrative   Work or School:Retired, used to do [place] with husband      Spiritual Beliefs:Jehovah's Witness      Lifestyle:walking 60 minutes daily and eating a healthy diet, does eat a lot of dairy    Outpatient Medications Prior to Visit  Medication Sig Dispense Refill  . Cholecalciferol (VITAMIN D3) 5000 units TABS Take 5,000 each by mouth daily.     . Cyanocobalamin (BL VITAMIN B-12 PO) Take by mouth.    . estradiol (CLIMARA - DOSED IN MG/24 HR) 0.025 mg/24hr patch Place 0.025 mg onto the skin once a week.     . Glutamine POWD Take by mouth.    Marland Kitchen MAGNESIUM MALATE PO Take by mouth.    . Menaquinone-7 (VITAMIN K2 PO) Take by mouth.    . NON FORMULARY Progesterone 80mg  cream    . NON FORMULARY Testosterone 0.25mg  cream    . Nutritional Supplements (DHEA PO) Take by mouth.    Marland Kitchen OVER THE COUNTER MEDICATION Calcium magnesium citrate 300mg  daily    . Probiotic Product (PROBIOTIC DAILY PO) Take 1 tablet by mouth daily.    Marland Kitchen thyroid (NP THYROID) 30 MG tablet Take 30 mg by mouth daily before breakfast.     No facility-administered medications prior to visit.       ROS:  Review of Systems  Constitutional: Negative for fatigue, fever and unexpected weight change.  Respiratory: Negative for cough, shortness of breath and wheezing.   Cardiovascular: Negative for chest pain, palpitations and leg swelling.  Gastrointestinal: Negative for blood in stool, constipation, diarrhea, nausea and vomiting.  Endocrine: Negative for cold intolerance, heat intolerance and polyuria.  Genitourinary: Negative for dyspareunia, dysuria, flank pain, frequency, genital sores, hematuria, menstrual problem, pelvic pain, urgency, vaginal bleeding, vaginal discharge and vaginal pain.  Musculoskeletal: Negative for back pain, joint swelling and myalgias.  Skin: Negative for rash.  Neurological: Negative for dizziness, syncope, light-headedness, numbness and headaches.  Hematological: Negative for adenopathy.  Psychiatric/Behavioral: Negative for  agitation, confusion, sleep disturbance and suicidal ideas. The patient is not nervous/anxious.   BREAST: No symptoms   OBJECTIVE:   Vitals:  BP 130/86   Pulse 66   Ht 5' 4.5" (1.638 m)   Wt 149 lb (67.6 kg)   BMI 25.18 kg/m   Physical Exam Vitals signs reviewed.  Constitutional:      Appearance: She is well-developed.  Neck:     Musculoskeletal: Normal range of motion.  Pulmonary:     Effort: Pulmonary  effort is normal. No respiratory distress.  Musculoskeletal: Normal range of motion.  Skin:    General: Skin is warm and dry.  Neurological:     General: No focal deficit present.     Mental Status: She is alert and oriented to person, place, and time.     Cranial Nerves: No cranial nerve deficit.  Psychiatric:        Mood and Affect: Mood normal.        Behavior: Behavior normal.        Thought Content: Thought content normal.        Judgment: Judgment normal.     Assessment/Plan: Hormone replacement therapy (HRT) - Discussed risks/benefits. Pt without sx. D/C testosterone, wean off estradiol. F/u at 2/20 annual to see how sx are and discuss weaning off prog.  Acquired hypothyroidism - Due to previous notes but pt states TSH was high normal. Can re-eval after other HRT meds addressed. Pt willing to come off armour thyroid, although ok to cont  Other osteoporosis without current pathological fracture - MD happy pt on testosterone. Can change over to DHEA 5 mg daily once stops testosterone.    Return in about 1 month (around 0/23/3435) for annual.  Alicia B. Copland, PA-C 07/10/2018 12:21 PM

## 2018-07-26 ENCOUNTER — Telehealth: Payer: Self-pay | Admitting: Gastroenterology

## 2018-07-26 NOTE — Telephone Encounter (Signed)
[facility] called stating they spoke with patient and she states she had called our office sometime in the past an cancelled her procedure with Dr Vicente Males for 07-28-2018. Please cancel

## 2018-07-26 NOTE — Telephone Encounter (Signed)
Patients procedure has been canceled.  It was documented in her referral note.  No Fee  Thanks Sharyn Lull

## 2018-07-28 ENCOUNTER — Ambulatory Visit
Admission: RE | Admit: 2018-07-28 | Payer: BLUE CROSS/BLUE SHIELD | Source: Home / Self Care | Admitting: Gastroenterology

## 2018-07-28 ENCOUNTER — Encounter: Admission: RE | Payer: Self-pay | Source: Home / Self Care

## 2018-07-28 SURGERY — COLONOSCOPY WITH PROPOFOL
Anesthesia: General

## 2018-08-18 ENCOUNTER — Encounter: Payer: Self-pay | Admitting: Obstetrics and Gynecology

## 2018-08-18 NOTE — Telephone Encounter (Signed)
Pt called nurse line about this also.  [phone]

## 2018-08-23 ENCOUNTER — Ambulatory Visit: Payer: BLUE CROSS/BLUE SHIELD | Admitting: Certified Nurse Midwife

## 2018-08-28 NOTE — Progress Notes (Signed)
PCP: Hubbard Hartshorn, FNP   Chief Complaint  Patient presents with  . Gynecologic Exam    HPI:      [name] is a 63 y.o. G3P3 who LMP was No LMP recorded. Patient is postmenopausal., presents today for her annual examination.  Her menses are absent due to menopause. She does not have PMB. She does occas have vasomotor sx and mood issues. She was on estradiol, testosterone, progesterone by different provider. Transferred here 1/20 due to insurance changes. Pt wanted to come off some of the hormones. We weaned her off estradiol and testosterone and pt is doing ok. Sx are tolerable. Would like to continue progesterone, particularly for bones/osteoporosis per previous MD.   Sex activity: not sexually active (husband with health issues). She does not have vaginal dryness.  Last Pap: August 17, 2016  Results were: no abnormalities /neg HPV DNA.  Hx of STDs: none  Last mammogram: December 17, 2016  Results were: normal--routine follow-up in 12 months. Pt willing to do Q2 yrs but doesn't want yearly. There is no FH of breast cancer. There is no FH of ovarian cancer. The patient does do self-breast exams.  Colonoscopy: never--is trying to schedule with MD at [facility] due to new insurance; had negative Cologuard 6/19, but has FH colon cancer in her father.    Tobacco use: The patient denies current or previous tobacco use. Alcohol use: none Exercise: moderately active  She does get adequate calcium and Vitamin D in her diet. DEXA: 2019 at [facility] with osteoporosis in spine, RT femoral neck, osteopenia in LT fem neck. Was on tymlos injections and had improved DEXA 2019. Doing with Raliegh Ip.   Will do labs with PCP. Suggested she manage TSH/armour thyroid. Has appt 4/20.   Past Medical History:  Diagnosis Date  . Arthritis   . Back pain   . Fracture of coccyx ([place])   . Frequent headaches   . Migraines   . No blood products 02/15/2017   Jehovah's Witness  .  Osteoporosis   . Thyroid disease     Past Surgical History:  Procedure Laterality Date  . WISDOM TOOTH EXTRACTION      Family History  Problem Relation Age of Onset  . Osteoporosis Mother        Mom passed away from pathological fractures  . Arthritis Mother   . CVA Mother        Margretta Sidle  . Colon cancer Father 73  . Heart failure Father   . Arthritis Father   . Drug abuse Brother   . Heart disease Paternal Grandfather   . Hypothyroidism Daughter   . Diabetes Mellitus II Daughter   . Diabetes Mellitus II Son     Social History   Socioeconomic History  . Marital status: Married    Spouse name: [name]  . Number of children: 3  . Years of education: Not on file  . Highest education level: Not on file  Occupational History  . Occupation: unemployed  Social Needs  . Financial resource strain: Not on file  . Food insecurity:    Worry: Never true    Inability: Never true  . Transportation needs:    Medical: No    Non-medical: No  Tobacco Use  . Smoking status: Never Smoker  . Smokeless tobacco: Never Used  Substance and Sexual Activity  . Alcohol use: Yes    Alcohol/week: 6.0 standard drinks    Types: 6 Standard drinks or equivalent  per week  . Drug use: No  . Sexual activity: Not Currently    Birth control/protection: Post-menopausal  Lifestyle  . Physical activity:    Days per week: 3 days    Minutes per session: 60 min  . Stress: Not at all  Relationships  . Social connections:    Talks on phone: More than three times a week    Gets together: More than three times a week    Attends religious service: More than 4 times per year    Active member of club or organization: No    Attends meetings of clubs or organizations: Never    Relationship status: Married  . Intimate partner violence:    Fear of current or ex partner: No    Emotionally abused: No    Physically abused: No    Forced sexual activity: No  Other Topics Concern  . Not on file  Social  History Narrative   Work or School:Retired, used to do [place] with husband      Spiritual Beliefs:Jehovah's Witness      Lifestyle:walking 60 minutes daily and eating a healthy diet, does eat a lot of dairy    Outpatient Medications Prior to Visit  Medication Sig Dispense Refill  . Cholecalciferol (VITAMIN D3) 5000 units TABS Take 5,000 each by mouth daily.     . Cyanocobalamin (BL VITAMIN B-12 PO) Take by mouth.    Marland Kitchen MAGNESIUM MALATE PO Take by mouth.    . Menaquinone-7 (VITAMIN K2 PO) Take by mouth.    . Nutritional Supplements (DHEA PO) Take by mouth.    Marland Kitchen OVER THE COUNTER MEDICATION Calcium magnesium citrate 300mg  daily    . Probiotic Product (PROBIOTIC DAILY PO) Take 1 tablet by mouth daily.    Marland Kitchen thyroid (NP THYROID) 30 MG tablet Take 30 mg by mouth daily before breakfast.    . NON FORMULARY Progesterone 80mg  cream    . estradiol (CLIMARA - DOSED IN MG/24 HR) 0.025 mg/24hr patch Place 0.025 mg onto the skin once a week.     . Glutamine POWD Take by mouth.    . NON FORMULARY Testosterone 0.25mg  cream     No facility-administered medications prior to visit.        ROS:  Review of Systems  Constitutional: Negative for fatigue, fever and unexpected weight change.  Respiratory: Negative for cough, shortness of breath and wheezing.   Cardiovascular: Negative for chest pain, palpitations and leg swelling.  Gastrointestinal: Negative for blood in stool, constipation, diarrhea, nausea and vomiting.  Endocrine: Negative for cold intolerance, heat intolerance and polyuria.  Genitourinary: Negative for dyspareunia, dysuria, flank pain, frequency, genital sores, hematuria, menstrual problem, pelvic pain, urgency, vaginal bleeding, vaginal discharge and vaginal pain.  Musculoskeletal: Negative for back pain, joint swelling and myalgias.  Skin: Negative for rash.  Neurological: Negative for dizziness, syncope, light-headedness, numbness and headaches.    Hematological: Negative for adenopathy.  Psychiatric/Behavioral: Negative for agitation, confusion, sleep disturbance and suicidal ideas. The patient is not nervous/anxious.   BREAST: No symptoms    Objective: BP 90/60   Pulse 66   Ht 5\' 5"  (1.651 m)   Wt 144 lb (65.3 kg)   BMI 23.96 kg/m    Physical Exam Constitutional:      Appearance: She is well-developed.  Genitourinary:     Vulva, vagina, cervix, uterus, right adnexa and left adnexa normal.     No vulval lesion or tenderness noted.  Vaginal atrophic mucosa present.     No vaginal discharge, erythema or tenderness.     No cervical polyp.     Uterus is not enlarged or tender.     No right or left adnexal mass present.     Right adnexa not tender.     Left adnexa not tender.  Neck:     Musculoskeletal: Normal range of motion.     Thyroid: No thyromegaly.  Cardiovascular:     Rate and Rhythm: Normal rate and regular rhythm.     Heart sounds: Normal heart sounds. No murmur.  Pulmonary:     Effort: Pulmonary effort is normal.     Breath sounds: Normal breath sounds.  Chest:     Breasts:        Right: No mass, nipple discharge, skin change or tenderness.        Left: No mass, nipple discharge, skin change or tenderness.  Abdominal:     Palpations: Abdomen is soft.     Tenderness: There is no abdominal tenderness. There is no guarding.  Musculoskeletal: Normal range of motion.  Neurological:     General: No focal deficit present.     Mental Status: She is alert and oriented to person, place, and time.     Cranial Nerves: No cranial nerve deficit.  Skin:    General: Skin is warm and dry.  Psychiatric:        Mood and Affect: Mood normal.        Behavior: Behavior normal.        Thought Content: Thought content normal.        Judgment: Judgment normal.  Vitals signs reviewed.      Assessment/Plan:  Encounter for annual routine gynecological examination  Screening for breast cancer - Pt to sched mammo  - Plan: MM 3D SCREEN BREAST BILATERAL  Hormone replacement therapy (HRT) - Rx RF progesterone. Sent to [facility].  - Plan: AMBULATORY NON FORMULARY MEDICATION  Vasomotor symptoms due to menopause - Tolerable after weaning off estradiol and testosterone. Can add back ERT prn. Pt to f/u prn. - Plan: AMBULATORY NON FORMULARY MEDICATION  Age-related osteoporosis without current pathological fracture - Spine, RT hip. Cont prog, calcium/Vit D, exercise. 2019 DEXA at [facility], rechk in 2 yrs  Screening for colon cancer - Pt sched colonoscopy at [facility].    Meds ordered this encounter  Medications  . AMBULATORY NON FORMULARY MEDICATION    Sig: Medication Name: Progesterone 12% cream Sig: Apply 1 ml to inner thigh nightly    Dispense:  30 mL    Refill:  12    Order Specific Question:   Supervising Provider    Answer:   Gae Dry [[id number]]            GYN counsel breast self exam, mammography screening, menopause, osteoporosis, adequate intake of calcium and vitamin D, diet and exercise    F/U  Return in about 1 year (around 08/29/2019).  Latravious Levitt B. Jeptha Hinnenkamp, PA-C 08/29/2018 9:09 AM

## 2018-08-29 ENCOUNTER — Ambulatory Visit (INDEPENDENT_AMBULATORY_CARE_PROVIDER_SITE_OTHER): Payer: BLUE CROSS/BLUE SHIELD | Admitting: Obstetrics and Gynecology

## 2018-08-29 ENCOUNTER — Encounter: Payer: Self-pay | Admitting: Obstetrics and Gynecology

## 2018-08-29 VITALS — BP 90/60 | HR 66 | Ht 65.0 in | Wt 144.0 lb

## 2018-08-29 DIAGNOSIS — Z01419 Encounter for gynecological examination (general) (routine) without abnormal findings: Secondary | ICD-10-CM | POA: Diagnosis not present

## 2018-08-29 DIAGNOSIS — Z79890 Hormone replacement therapy (postmenopausal): Secondary | ICD-10-CM

## 2018-08-29 DIAGNOSIS — Z1239 Encounter for other screening for malignant neoplasm of breast: Secondary | ICD-10-CM

## 2018-08-29 DIAGNOSIS — N951 Menopausal and female climacteric states: Secondary | ICD-10-CM

## 2018-08-29 DIAGNOSIS — Z1211 Encounter for screening for malignant neoplasm of colon: Secondary | ICD-10-CM

## 2018-08-29 DIAGNOSIS — M810 Age-related osteoporosis without current pathological fracture: Secondary | ICD-10-CM

## 2018-08-29 MED ORDER — AMBULATORY NON FORMULARY MEDICATION: 12 refills | Status: DC

## 2018-08-29 NOTE — Patient Instructions (Signed)
I value your feedback and entrusting us with your care. If you get a [facility] patient survey, I would appreciate you taking the time to let us know about your experience today. Thank you!  [facility]: [phone]    

## 2018-10-23 ENCOUNTER — Encounter: Payer: BLUE CROSS/BLUE SHIELD | Admitting: Family Medicine

## 2018-11-22 ENCOUNTER — Ambulatory Visit: Payer: Self-pay | Admitting: Family Medicine

## 2018-11-22 NOTE — Telephone Encounter (Signed)
Called patient left message on voicemail for her to call office with concerns

## 2018-11-22 NOTE — Telephone Encounter (Signed)
Documentation reviewed.  Would like her to present to the ER if this occurs again, otherwise, please recommend visit for tomorrow or Friday.  If chest pain, shortness of breath, or palpitation/tachycardia - she must go to ER.

## 2018-11-22 NOTE — Telephone Encounter (Signed)
Pt. Reports she woke up at 0430 this morning, not feeling well. Heart rate 140, BP 123/102. Heart rate lasted "about one hour." Heart rate now 90, BP 90/60. Has a headache. No chest pain or shortness of breath. Spoke with Melissa in the practice and will forward triage note for review.  Answer Assessment - Initial Assessment Questions 1. DESCRIPTION: "Please describe your heart rate or heart beat that you are having" (e.g., fast/slow, regular/irregular, skipped or extra beats, "palpitations")     Fast - 140 2. ONSET: "When did it start?" (Minutes, hours or days)      0430 3. DURATION: "How long does it last" (e.g., seconds, minutes, hours)     Lasted about 1 hour 4. PATTERN "Does it come and go, or has it been constant since it started?"  "Does it get worse with exertion?"   "Are you feeling it now?"     Has stopped 5. TAP: "Using your hand, can you tap out what you are feeling on a chair or table in front of you, so that I can hear?" (Note: not all patients can do this)       No 6. HEART RATE: "Can you tell me your heart rate?" "How many beats in 15 seconds?"  (Note: not all patients can do this)       Normal now 7. RECURRENT SYMPTOM: "Have you ever had this before?" If so, ask: "When was the last time?" and "What happened that time?"      No 8. CAUSE: "What do you think is causing the palpitations?"     Unsure 9. CARDIAC HISTORY: "Do you have any history of heart disease?" (e.g., heart attack, angina, bypass surgery, angioplasty, arrhythmia)      No 10. OTHER SYMPTOMS: "Do you have any other symptoms?" (e.g., dizziness, chest pain, sweating, difficulty breathing)       Headache 11. PREGNANCY: "Is there any chance you are pregnant?" "When was your last menstrual period?"       No  Protocols used: HEART RATE AND HEARTBEAT QUESTIONS-A-AH

## 2018-11-22 NOTE — Telephone Encounter (Signed)
Called left message for patient, please make appointment for her if she calls back

## 2019-09-18 ENCOUNTER — Encounter: Payer: Self-pay | Admitting: Certified Nurse Midwife

## 2020-09-25 DIAGNOSIS — C20 Malignant neoplasm of rectum: Secondary | ICD-10-CM | POA: Diagnosis not present

## 2020-09-25 DIAGNOSIS — M810 Age-related osteoporosis without current pathological fracture: Secondary | ICD-10-CM | POA: Diagnosis not present

## 2020-09-25 DIAGNOSIS — Z7185 Encounter for immunization safety counseling: Secondary | ICD-10-CM | POA: Diagnosis not present

## 2020-09-25 DIAGNOSIS — E559 Vitamin D deficiency, unspecified: Secondary | ICD-10-CM | POA: Diagnosis not present

## 2020-09-29 ENCOUNTER — Encounter (HOSPITAL_COMMUNITY): Payer: Self-pay

## 2020-09-29 NOTE — Progress Notes (Signed)
I have attempted to reach the patient for an introductory phone call. Unable to reach the patient at this time, I will plan to meet with her during her initial visit with Dr. Delton Coombes

## 2020-09-30 DIAGNOSIS — Z23 Encounter for immunization: Secondary | ICD-10-CM | POA: Diagnosis not present

## 2020-10-01 ENCOUNTER — Inpatient Hospital Stay (HOSPITAL_COMMUNITY): Payer: Medicare Other

## 2020-10-01 ENCOUNTER — Inpatient Hospital Stay (HOSPITAL_COMMUNITY): Payer: Medicare Other | Attending: Hematology | Admitting: Hematology

## 2020-10-01 ENCOUNTER — Encounter (HOSPITAL_COMMUNITY): Payer: Self-pay | Admitting: Hematology

## 2020-10-01 ENCOUNTER — Other Ambulatory Visit: Payer: Self-pay

## 2020-10-01 VITALS — BP 122/54 | HR 70 | Temp 96.9°F | Resp 18 | Ht 64.0 in | Wt 122.2 lb

## 2020-10-01 DIAGNOSIS — Z800 Family history of malignant neoplasm of digestive organs: Secondary | ICD-10-CM | POA: Diagnosis not present

## 2020-10-01 DIAGNOSIS — C20 Malignant neoplasm of rectum: Secondary | ICD-10-CM | POA: Diagnosis not present

## 2020-10-01 LAB — CBC WITH DIFFERENTIAL/PLATELET
Abs Immature Granulocytes: 0.01 10*3/uL (ref 0.00–0.07)
Basophils Absolute: 0 10*3/uL (ref 0.0–0.1)
Basophils Relative: 0 %
Eosinophils Absolute: 0.1 10*3/uL (ref 0.0–0.5)
Eosinophils Relative: 1 %
HCT: 44 % (ref 36.0–46.0)
Hemoglobin: 14 g/dL (ref 12.0–15.0)
Immature Granulocytes: 0 %
Lymphocytes Relative: 24 %
Lymphs Abs: 1.6 10*3/uL (ref 0.7–4.0)
MCH: 32.2 pg (ref 26.0–34.0)
MCHC: 31.8 g/dL (ref 30.0–36.0)
MCV: 101.1 fL — ABNORMAL HIGH (ref 80.0–100.0)
Monocytes Absolute: 0.7 10*3/uL (ref 0.1–1.0)
Monocytes Relative: 10 %
Neutro Abs: 4.4 10*3/uL (ref 1.7–7.7)
Neutrophils Relative %: 65 %
Platelets: 277 10*3/uL (ref 150–400)
RBC: 4.35 MIL/uL (ref 3.87–5.11)
RDW: 12.9 % (ref 11.5–15.5)
WBC: 6.8 10*3/uL (ref 4.0–10.5)
nRBC: 0 % (ref 0.0–0.2)

## 2020-10-01 LAB — COMPREHENSIVE METABOLIC PANEL
ALT: 17 U/L (ref 0–44)
AST: 18 U/L (ref 15–41)
Albumin: 4.2 g/dL (ref 3.5–5.0)
Alkaline Phosphatase: 70 U/L (ref 38–126)
Anion gap: 8 (ref 5–15)
BUN: 10 mg/dL (ref 8–23)
CO2: 25 mmol/L (ref 22–32)
Calcium: 10.2 mg/dL (ref 8.9–10.3)
Chloride: 105 mmol/L (ref 98–111)
Creatinine, Ser: 0.48 mg/dL (ref 0.44–1.00)
GFR, Estimated: >60 mL/min (ref >60)
Glucose, Bld: 93 mg/dL (ref 70–99)
Potassium: 3.8 mmol/L (ref 3.5–5.1)
Sodium: 138 mmol/L (ref 135–145)
Total Bilirubin: 0.7 mg/dL (ref 0.3–1.2)
Total Protein: 7.3 g/dL (ref 6.5–8.1)

## 2020-10-01 NOTE — Progress Notes (Signed)
[facility] [address], [place] [address]   CLINIC:  Medical Oncology/Hematology  CONSULT NOTE  Patient Care Team: Hubbard Hartshorn, FNP as PCP - General (Family Medicine) Brien Mates, RN as Oncology Nurse Navigator (Oncology)  CHIEF COMPLAINTS/PURPOSE OF CONSULTATION:  Evaluation of rectal cancer  HISTORY OF PRESENTING ILLNESS:  [name] 65 y.o. female is here because of evaluation of rectal cancer, at the request of Dr. Rachell Cipro. She was diagnosed with rectal cancer on 07/10/2019 at [facility].  Today she is accompanied by her husband and she reports feeling well. They moved to [place], [place] for alternative treatment where she was doing vitamin C infusions for her rectal cancer; initially the infusions were several times per weeks and then once a week. She notes having hemorrhoid flare-ups which are a chronic issue for her and notes having a tugging sensation, but denies having melena, hematochezia, rectal pain, diarrhea, nausea or vomiting. Her appetite is excellent and she is having 1 BM daily. She has lost about 30 lbs since her cancer diagnosis due to change in diet; she changed to a vegan diet and cut out junk food and meat.  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 She is a non-smoker. Her father had colon cancer.  MEDICAL HISTORY:  Past Medical History:  Diagnosis Date  . Arthritis   . Back pain   . Fracture of coccyx ([place])   . Frequent headaches   . Migraines   . No blood products 02/15/2017   Jehovah's Witness  . Osteoporosis   . Thyroid disease     SURGICAL HISTORY: Past Surgical History:  Procedure Laterality Date  . WISDOM TOOTH EXTRACTION      SOCIAL HISTORY: Social History   Socioeconomic History  . Marital status: Married    Spouse name: [name]  . Number of children: 3  . Years of education: Not on file  . Highest education level: Not on file  Occupational History  .  Occupation: unemployed  Tobacco Use  . Smoking status: Never Smoker  . Smokeless tobacco: Never Used  Vaping Use  . Vaping Use: Never used  Substance and Sexual Activity  . Alcohol use: Not Currently    Alcohol/week: 6.0 standard drinks    Types: 6 Standard drinks or equivalent per week    Comment: occas. wine  . Drug use: No  . Sexual activity: Not Currently    Birth control/protection: Post-menopausal  Other Topics Concern  . Not on file  Social History Narrative   Work or School:Retired, used to do [place] with husband      Spiritual Beliefs:Jehovah's Witness      Lifestyle:walking 60 minutes daily and eating a healthy diet, does eat a lot of dairy   Social Determinants of Health   Financial Resource Strain: Low Risk   . Difficulty of Paying Living Expenses: Not hard at all  Food Insecurity: No Food Insecurity  . Worried About Charity fundraiser in the Last Year: Never true  . Ran Out of Food in the Last Year: Never true  Transportation Needs: No Transportation Needs  . Lack of Transportation (Medical): No  . Lack of Transportation (Non-Medical): No  Physical Activity: Insufficiently Active  . Days of Exercise per Week: 4 days  . Minutes of Exercise per Session: 30 min  Stress: No Stress Concern Present  . Feeling of Stress : Not at  all  Social Connections: Socially Integrated  . Frequency of Communication with Friends and Family: More than three times a week  . Frequency of Social Gatherings with Friends and Family: More than three times a week  . Attends Religious Services: More than 4 times per year  . Active Member of Clubs or Organizations: Yes  . Attends Archivist Meetings: Never  . Marital Status: Married  Human resources officer Violence: Not At Risk  . Fear of Current or Ex-Partner: No  . Emotionally Abused: No  . Physically Abused: No  . Sexually Abused: No    FAMILY HISTORY: Family History  Problem Relation Age  of Onset  . Osteoporosis Mother        Mom passed away from pathological fractures  . Arthritis Mother   . CVA Mother        Margretta Sidle  . Colon cancer Father 71  . Heart failure Father   . Arthritis Father   . Drug abuse Brother   . Heart disease Paternal Grandfather   . Hypothyroidism Daughter   . Diabetes Mellitus II Daughter   . Diabetes Mellitus II Son     ALLERGIES:  is allergic to almond meal, asa [aspirin], crab [shellfish allergy], ibuprofen, shellfish-derived products, spinach, tomato, and other.  MEDICATIONS:  Current Outpatient Medications  Medication Sig Dispense Refill  . AMBULATORY NON FORMULARY MEDICATION Medication Name: Progesterone 12% cream Sig: Apply 1 ml to inner thigh nightly 30 mL 12  . Cholecalciferol (VITAMIN D3) 5000 units TABS Take 5,000 each by mouth daily.     . Cyanocobalamin (BL VITAMIN B-12 PO) Take by mouth.    Marland Kitchen MAGNESIUM MALATE PO Take by mouth.    . Menaquinone-7 (VITAMIN K2 PO) Take by mouth.    . NON FORMULARY Vitamin d 3 5,65mcg    . Nutritional Supplements (DHEA PO) Take by mouth.    Marland Kitchen OVER THE COUNTER MEDICATION Calcium magnesium citrate 300mg  daily    . Probiotic Product (PROBIOTIC DAILY PO) Take 1 tablet by mouth daily.     No current facility-administered medications for this visit.    REVIEW OF SYSTEMS:   Review of Systems  Constitutional: Positive for appetite change (75%) and fatigue (75%).  Gastrointestinal: Negative for blood in stool, diarrhea and rectal pain.  All other systems reviewed and are negative.    PHYSICAL EXAMINATION: ECOG PERFORMANCE STATUS: 0 - Asymptomatic  Vitals:   10/01/20 0806  BP: (!) 122/54  Pulse: 70  Resp: 18  Temp: (!) 96.9 F (36.1 C)  SpO2: 100%   Filed Weights   10/01/20 0806  Weight: 122 lb 3.2 oz (55.4 kg)   Physical Exam Vitals reviewed.  Constitutional:      Appearance: Normal appearance.  Cardiovascular:     Rate and Rhythm: Normal rate and regular rhythm.     Pulses: Normal  pulses.     Heart sounds: Normal heart sounds.  Pulmonary:     Effort: Pulmonary effort is normal.     Breath sounds: Normal breath sounds.  Chest:  Breasts:     Right: No axillary adenopathy or supraclavicular adenopathy.     Left: No axillary adenopathy or supraclavicular adenopathy.    Abdominal:     Palpations: Abdomen is soft. There is no hepatomegaly, splenomegaly or mass.     Tenderness: There is no abdominal tenderness.     Hernia: No hernia is present.  Genitourinary:    Exam position: Knee-chest position.     Rectum: Mass (on  posterior wall) present. No tenderness or anal fissure. Normal anal tone.  Musculoskeletal:     Right lower leg: No edema.     Left lower leg: No edema.  Lymphadenopathy:     Cervical: No cervical adenopathy.     Upper Body:     Right upper body: No supraclavicular, axillary or pectoral adenopathy.     Left upper body: No supraclavicular, axillary or pectoral adenopathy.     Lower Body: No right inguinal adenopathy. No left inguinal adenopathy.  Neurological:     General: No focal deficit present.     Mental Status: She is alert and oriented to person, place, and time.  Psychiatric:        Mood and Affect: Mood normal.        Behavior: Behavior normal.      LABORATORY DATA:  I have reviewed the data as listed CBC Latest Ref Rng & Units 09/29/2017 05/15/2016 02/03/2016  WBC 3.6 - 11.0 K/uL 7.3 4.9 6.5  Hemoglobin 12.0 - 16.0 g/dL 13.6 13.2 12.8  Hematocrit 35.0 - 47.0 % 40.4 39.5 36.0  Platelets 150 - 440 K/uL 284 256 220   CMP Latest Ref Rng & Units 09/29/2017 05/15/2016 02/03/2016  Glucose 65 - 99 mg/dL 130(H) 94 115(H)  BUN 6 - 20 mg/dL 14 <5(L) <5(L)  Creatinine 0.44 - 1.00 mg/dL 0.57 0.51 0.42(L)  Sodium 135 - 145 mmol/L 137 137 127(L)  Potassium 3.5 - 5.1 mmol/L 4.0 3.8 3.1(L)  Chloride 101 - 111 mmol/L 106 107 96(L)  CO2 22 - 32 mmol/L 26 24 22   Calcium 8.9 - 10.3 mg/dL 9.7 9.7 8.9  Total Protein 6.5 - 8.1 g/dL 7.0 6.4(L) 6.2(L)   Total Bilirubin 0.3 - 1.2 mg/dL 0.5 0.8 0.5  Alkaline Phos 38 - 126 U/L 55 55 60  AST 15 - 41 U/L 23 20 26   ALT 14 - 54 U/L 20 17 17     RADIOGRAPHIC STUDIES: I have personally reviewed the radiological images as listed and agreed with the findings in the report. No results found.  ASSESSMENT:  1.  Stage III (T3N2) rectal cancer: -Initially diagnosed on 07/10/2019 with colonoscopy and biopsy of the rectal mass showing high-grade dysplasia. -MRI of the pelvis on 10/05/2019 showed low rectal mass, T3N2.  CEA was 2.4. -CT chest on 07/18/2019 showed multiple right upper lobe pleural-based nodules, nonspecific. -Even though biopsy was high-grade dysplasia, because of MRI findings, thought to be rectal cancer. -She declined TNT and surgery. -She moved to [place] and received alternative treatments in the form of vitamin C infusions. -She reported improvement in bleeding and melena.  She does not report any rectal pain. -She reported 30 pound weight loss since diagnosis but did dietary changes and converted to vegan.  2.  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 -She is a non-smoker.  She is reportedly Jehovah's Witness from previous chart. -Father had colon cancer.   PLAN:  1.  Stage III (T3N2) low rectal cancer: -I have reviewed MRI reports from [place]. -Physical examination shows vaguely palpable mass in the posterior wall. -Recommended restaging CT CAP and MRI of the pelvis. -She declined CT scan due to radiation concerns.  Will order MRI of the abdomen and pelvis at this time and defer chest imaging. -We will also check CEA level and routine labs today.   All questions were answered. The patient knows to call the clinic with any problems, questions or concerns.   Derek Jack,  MD, 10/01/20 8:43 AM  [facility] [phone]   I, Milinda Antis, am acting as a scribe for Dr. Sanda Linger.  I,  Derek Jack MD, have reviewed the above documentation for accuracy and completeness, and I agree with the above.

## 2020-10-01 NOTE — Patient Instructions (Signed)
[place] at [facility] Discharge Instructions  You were seen and examined today by Dr. Delton Coombes. Dr. Delton Coombes is a medical oncologist, meaning he specializes in the management of cancer diagnoses with medications. Dr. Delton Coombes discussed your past medical history, family history of cancer and the events that led to you being here today.   Dr. Delton Coombes discussed your rectal cancer diagnoses, recent plan of care and reviewed your recent medical records. Dr. Delton Coombes discussed any symptoms that you are currently having.   Dr. Delton Coombes has recommended repeat imaging and lab work. This will give [place] a better understanding of where your cancer is at currently. Dr. Delton Coombes will see you back following the scans to go over the results and discuss treatment options.   Thank you for choosing [place] at [facility] to provide your oncology and hematology care.  To afford each patient quality time with our provider, please arrive at least 15 minutes before your scheduled appointment time.   If you have a lab appointment with the [facility] please come in thru the Main Entrance and check in at the main information desk.  You need to re-schedule your appointment should you arrive 10 or more minutes late.  We strive to give you quality time with our providers, and arriving late affects you and other patients whose appointments are after yours.  Also, if you no show three or more times for appointments you may be dismissed from the clinic at the providers discretion.     Again, thank you for choosing [facility].  Our hope is that these requests will decrease the amount of time that you wait before being seen by our physicians.       _____________________________________________________________  Should you have questions after your visit to [facility], please contact our office at [phone] and follow the prompts.   Our office hours are 8:00 a.m. and 4:30 p.m. Monday - Friday.  Please note that voicemails left after 4:00 p.m. may not be returned until the following business day.  We are closed weekends and major holidays.  You do have access to a nurse 24-7, just call the main number to the clinic [phone] and do not press any options, hold on the line and a nurse will answer the phone.    For prescription refill requests, have your pharmacy contact our office and allow 72 hours.    Due to Covid, you will need to wear a mask upon entering the hospital. If you do not have a mask, a mask will be given to you at the Main Entrance upon arrival. For doctor visits, patients may have 1 support person age 72 or older with them. For treatment visits, patients can not have anyone with them due to social distancing guidelines and our immunocompromised population.

## 2020-10-02 LAB — CEA: CEA: 2.8 ng/mL (ref 0.0–4.7)

## 2020-10-15 ENCOUNTER — Ambulatory Visit (HOSPITAL_COMMUNITY)
Admission: RE | Admit: 2020-10-15 | Discharge: 2020-10-15 | Disposition: A | Discharge status: Home / Self Care | Payer: Medicare Other | Source: Ambulatory Visit | Attending: Hematology | Admitting: Hematology

## 2020-10-15 ENCOUNTER — Other Ambulatory Visit (HOSPITAL_COMMUNITY): Payer: Self-pay | Admitting: Hematology

## 2020-10-15 ENCOUNTER — Other Ambulatory Visit: Payer: Self-pay

## 2020-10-15 ENCOUNTER — Ambulatory Visit
Admission: RE | Admit: 2020-10-15 | Discharge: 2020-10-15 | Disposition: A | Discharge status: Home / Self Care | Payer: Self-pay | Source: Ambulatory Visit | Attending: Hematology | Admitting: Hematology

## 2020-10-15 DIAGNOSIS — N281 Cyst of kidney, acquired: Secondary | ICD-10-CM | POA: Diagnosis not present

## 2020-10-15 DIAGNOSIS — C20 Malignant neoplasm of rectum: Secondary | ICD-10-CM

## 2020-10-15 DIAGNOSIS — C269 Malignant neoplasm of ill-defined sites within the digestive system: Secondary | ICD-10-CM | POA: Diagnosis not present

## 2020-10-15 DIAGNOSIS — C49A0 Gastrointestinal stromal tumor, unspecified site: Secondary | ICD-10-CM | POA: Diagnosis not present

## 2020-10-15 DIAGNOSIS — Z85048 Personal history of other malignant neoplasm of rectum, rectosigmoid junction, and anus: Secondary | ICD-10-CM | POA: Diagnosis not present

## 2020-10-15 DIAGNOSIS — K6289 Other specified diseases of anus and rectum: Secondary | ICD-10-CM | POA: Diagnosis not present

## 2020-10-15 MED ORDER — GADOBUTROL 1 MMOL/ML IV SOLN
7.5000 mL | Freq: Once | INTRAVENOUS | Status: AC | PRN
  Administered 2020-10-15: 7.5 mL via INTRAVENOUS

## 2020-10-20 ENCOUNTER — Other Ambulatory Visit (HOSPITAL_COMMUNITY): Payer: Self-pay | Admitting: Hematology

## 2020-10-20 ENCOUNTER — Inpatient Hospital Stay (HOSPITAL_COMMUNITY): Payer: Medicare Other | Admitting: Hematology

## 2020-10-20 ENCOUNTER — Ambulatory Visit
Admission: RE | Admit: 2020-10-20 | Discharge: 2020-10-20 | Disposition: A | Discharge status: Home / Self Care | Payer: Self-pay | Source: Ambulatory Visit | Attending: Hematology | Admitting: Hematology

## 2020-10-20 ENCOUNTER — Other Ambulatory Visit: Payer: Self-pay

## 2020-10-20 VITALS — BP 101/62 | HR 85 | Temp 97.1°F | Resp 18 | Wt 121.3 lb

## 2020-10-20 DIAGNOSIS — E559 Vitamin D deficiency, unspecified: Secondary | ICD-10-CM | POA: Diagnosis not present

## 2020-10-20 DIAGNOSIS — M810 Age-related osteoporosis without current pathological fracture: Secondary | ICD-10-CM | POA: Diagnosis not present

## 2020-10-20 DIAGNOSIS — C20 Malignant neoplasm of rectum: Secondary | ICD-10-CM

## 2020-10-20 DIAGNOSIS — Z800 Family history of malignant neoplasm of digestive organs: Secondary | ICD-10-CM | POA: Diagnosis not present

## 2020-10-20 NOTE — Patient Instructions (Signed)
[place] at [facility] Discharge Instructions  You were seen today by Dr. Delton Coombes. He went over your recent results and scans. Dr. Delton Coombes will call you back in 1 day for follow up.   Thank you for choosing [place] at [facility] to provide your oncology and hematology care.  To afford each patient quality time with our provider, please arrive at least 15 minutes before your scheduled appointment time.   If you have a lab appointment with the [facility] please come in thru the Main Entrance and check in at the main information desk  You need to re-schedule your appointment should you arrive 10 or more minutes late.  We strive to give you quality time with our providers, and arriving late affects you and other patients whose appointments are after yours.  Also, if you no show three or more times for appointments you may be dismissed from the clinic at the providers discretion.     Again, thank you for choosing [facility].  Our hope is that these requests will decrease the amount of time that you wait before being seen by our physicians.       _____________________________________________________________  Should you have questions after your visit to [facility], please contact our office at [phone] between the hours of 8:00 a.m. and 4:30 p.m.  Voicemails left after 4:00 p.m. will not be returned until the following business day.  For prescription refill requests, have your pharmacy contact our office and allow 72 hours.    Cancer Center Support Programs:   > Cancer Support Group  2nd Tuesday of the month 1pm-2pm, Journey Room

## 2020-10-20 NOTE — Progress Notes (Signed)
Kathy Zuniga, [place] [id number]   CLINIC:  Medical Oncology/Hematology  PCP:  Fanny Bien, MD [address] / [place] [address] [phone]   REASON FOR VISIT:  Follow-up for rectal cancer  PRIOR THERAPY: None  NGS Results: Not done  CURRENT THERAPY: Under work-up  BRIEF ONCOLOGIC HISTORY:  Oncology History   No history exists.    CANCER STAGING: Cancer Staging No matching staging information was found for the patient.  INTERVAL HISTORY:  Kathy Zuniga, a 65 y.o. female, returns for routine follow-up of her rectal cancer. Kathy Zuniga was last seen on 10/01/2020.   Today she is accompanied by her husband and she reports feeling well. She denies having hematochezia or changes in her BM's.   REVIEW OF SYSTEMS:  Review of Systems  Constitutional: Negative for appetite change and fatigue.  Gastrointestinal: Negative for blood in stool, constipation and diarrhea.  All other systems reviewed and are negative.   PAST MEDICAL/SURGICAL HISTORY:  Past Medical History:  Diagnosis Date  . Arthritis   . Back pain   . Fracture of coccyx ([place])   . Frequent headaches   . Migraines   . No blood products 02/15/2017   Jehovah's Witness  . Osteoporosis   . Thyroid disease    Past Surgical History:  Procedure Laterality Date  . WISDOM TOOTH EXTRACTION      SOCIAL HISTORY:  Social History   Socioeconomic History  . Marital status: Married    Spouse name: [name]  . Number of children: 3  . Years of education: Not on file  . Highest education level: Not on file  Occupational History  . Occupation: unemployed  Tobacco Use  . Smoking status: Never Smoker  . Smokeless tobacco: Never Used  Vaping Use  . Vaping Use: Never used  Substance and Sexual Activity  . Alcohol use: Not Currently    Alcohol/week: 6.0 standard drinks    Types: 6 Standard drinks or equivalent per week    Comment: occas. wine  .  Drug use: No  . Sexual activity: Not Currently    Birth control/protection: Post-menopausal  Other Topics Concern  . Not on file  Social History Narrative   Work or School:Retired, used to do [employer] with husband      Spiritual Beliefs:Jehovah's Witness      Lifestyle:walking 60 minutes daily and eating a healthy diet, does eat a lot of dairy   Social Determinants of Health   Financial Resource Strain: Low Risk   . Difficulty of Paying Living Expenses: Not hard at all  Food Insecurity: No Food Insecurity  . Worried About Charity fundraiser in the Last Year: Never true  . Ran Out of Food in the Last Year: Never true  Transportation Needs: No Transportation Needs  . Lack of Transportation (Medical): No  . Lack of Transportation (Non-Medical): No  Physical Activity: Insufficiently Active  . Days of Exercise per Week: 4 days  . Minutes of Exercise per Session: 30 min  Stress: No Stress Concern Present  . Feeling of Stress : Not at all  Social Connections: Socially Integrated  . Frequency of Communication with Friends and Family: More than three times a week  . Frequency of Social Gatherings with Friends and Family: More than three times a week  . Attends Religious Services: More than 4 times per year  . Active Member of Clubs  or Organizations: Yes  . Attends Archivist Meetings: Never  . Marital Status: Married  Human resources officer Violence: Not At Risk  . Fear of Current or Ex-Partner: No  . Emotionally Abused: No  . Physically Abused: No  . Sexually Abused: No    FAMILY HISTORY:  Family History  Problem Relation Age of Onset  . Osteoporosis Mother        Mom passed away from pathological fractures  . Arthritis Mother   . CVA Mother        Margretta Sidle  . Colon cancer Father 9  . Heart failure Father   . Arthritis Father   . Drug abuse Brother   . Heart disease Paternal Grandfather   . Hypothyroidism Daughter   . Diabetes  Mellitus II Daughter   . Diabetes Mellitus II Son     CURRENT MEDICATIONS:  Current Outpatient Medications  Medication Sig Dispense Refill  . AMBULATORY NON FORMULARY MEDICATION Medication Name: Progesterone 12% cream Sig: Apply 1 ml to inner thigh nightly 30 mL 12  . Cholecalciferol (VITAMIN D3) 5000 units TABS Take 5,000 each by mouth daily.     . Cyanocobalamin (BL VITAMIN B-12 PO) Take by mouth.    Marland Kitchen MAGNESIUM MALATE PO Take by mouth.    . Menaquinone-7 (VITAMIN K2 PO) Take by mouth.    . NON FORMULARY Vitamin d 3 5,83mcg    . OVER THE COUNTER MEDICATION Calcium magnesium citrate 300mg  daily    . Probiotic Product (PROBIOTIC DAILY PO) Take 1 tablet by mouth daily. (Patient not taking: Reported on 10/20/2020)     No current facility-administered medications for this visit.    ALLERGIES:  Allergies  Allergen Reactions  . Almond Meal   . Asa [Aspirin] Other (See Comments)    GI bleed Gi bleed   . Crab [Shellfish Allergy]     clams  . Ibuprofen Other (See Comments)    GI bleed  . Shellfish-Derived Products     clams  . Spinach   . Tomato Other (See Comments)  . Other     Green Beans, zucchini, squash    PHYSICAL EXAM:  Performance status (ECOG): 0 - Asymptomatic  Vitals:   10/20/20 1538  BP: 101/62  Pulse: 85  Resp: 18  Temp: (!) 97.1 F (36.2 C)  SpO2: 97%   Wt Readings from Last 3 Encounters:  10/20/20 121 lb 4.8 oz (55 kg)  10/01/20 122 lb 3.2 oz (55.4 kg)  08/29/18 144 lb (65.3 kg)   Physical Exam Vitals reviewed.  Constitutional:      Appearance: Normal appearance.  Neurological:     General: No focal deficit present.     Mental Status: She is alert and oriented to person, place, and time.  Psychiatric:        Mood and Affect: Mood normal.        Behavior: Behavior normal.      LABORATORY DATA:  I have reviewed the labs as listed.  CBC Latest Ref Rng & Units 10/01/2020 09/29/2017 05/15/2016  WBC 4.0 - 10.5 K/uL 6.8 7.3 4.9  Hemoglobin 12.0 -  15.0 g/dL 14.0 13.6 13.2  Hematocrit 36.0 - 46.0 % 44.0 40.4 39.5  Platelets 150 - 400 K/uL 277 284 256   CMP Latest Ref Rng & Units 10/01/2020 09/29/2017 05/15/2016  Glucose 70 - 99 mg/dL 93 130(H) 94  BUN 8 - 23 mg/dL 10 14 <5(L)  Creatinine 0.44 - 1.00 mg/dL 0.48 0.57 0.51  Sodium 135 -  145 mmol/L 138 137 137  Potassium 3.5 - 5.1 mmol/L 3.8 4.0 3.8  Chloride 98 - 111 mmol/L 105 106 107  CO2 22 - 32 mmol/L 25 26 24   Calcium 8.9 - 10.3 mg/dL 10.2 9.7 9.7  Total Protein 6.5 - 8.1 g/dL 7.3 7.0 6.4(L)  Total Bilirubin 0.3 - 1.2 mg/dL 0.7 0.5 0.8  Alkaline Phos 38 - 126 U/L 70 55 55  AST 15 - 41 U/L 18 23 20   ALT 0 - 44 U/L 17 20 17     DIAGNOSTIC IMAGING:  I have independently reviewed the scans and discussed with the patient. MR ABDOMEN WWO CONTRAST  Result Date: 10/15/2020 CLINICAL DATA:  Gastrointestinal cancer, assess treatment response. EXAM: MRI ABDOMEN WITHOUT AND WITH CONTRAST TECHNIQUE: Multiplanar multisequence MR imaging of the abdomen was performed both before and after the administration of intravenous contrast. CONTRAST:  7.11mL GADAVIST GADOBUTROL 1 MMOL/ML IV SOLN COMPARISON:  No comparison imaging. FINDINGS: Lower chest: Incidental imaging of the lung bases without effusion or sign of consolidation. Hepatobiliary: No focal, suspicious hepatic lesion. No pericholecystic stranding. No biliary duct dilation. Portal vein is patent. Pancreas: Normal intrinsic T1 signal. No ductal dilation or sign of inflammation. No focal lesion. Spleen:  Normal size and contour without focal lesion. Adrenals/Urinary Tract: Adrenal glands are normal. Symmetric renal enhancement. No focal, suspicious renal lesion. Small cyst in the lower pole the LEFT kidney. Images mildly motion limited. Stomach/Bowel: No acute gastrointestinal process to the extent evaluated. Please see pelvis for further detail regarding rectal primary and post treatment related findings. Stool fills much of the colon.  Vascular/Lymphatic: Normal caliber abdominal aorta. There is no gastrohepatic or hepatoduodenal ligament lymphadenopathy. No retroperitoneal or mesenteric lymphadenopathy. Other:  No ascites Musculoskeletal: No suspicious bone lesions identified. IMPRESSION: 1. No evidence of metastatic disease to the abdomen. Electronically Signed   By: Zetta Bills M.D.   On: 10/15/2020 14:49     ASSESSMENT:  1.  Stage III (T3N2) rectal cancer: -Initially diagnosed on 07/10/2019 with colonoscopy and biopsy of the rectal mass showing high-grade dysplasia. -MRI of the pelvis on 10/05/2019 showed low rectal mass, T3N2.  CEA was 2.4. -CT chest on 07/18/2019 showed multiple right upper lobe pleural-based nodules, nonspecific. -Even though biopsy was high-grade dysplasia, because of MRI findings, thought to be rectal cancer. -She declined TNT and surgery. -She moved to [place] and received alternative treatments in the form of vitamin C infusions. -She reported improvement in bleeding and melena.  She does not report any rectal pain. -She reported 30 pound weight loss since diagnosis but did dietary changes and converted to vegan. - Rectal exam at initial visit showed some thickening of posterior wall of the rectum with no clear mass.  2.  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 -She is a non-smoker.  She is reportedly Jehovah's Witness from previous chart. -Father had colon cancer.   PLAN:  1.  Stage III (T3N2) low rectal cancer: -She does not report any bleeding per rectum at this time.  No rectal pain reported.  She reports that her bowels are moving very well. - Reviewed results of the MRI of the abdomen which did not show any evidence of metastatic disease. - CEA was 2.8.  LFTs were normal.  CBC was normal. - I did not discuss results of the MRI of the pelvis at this visit as results were not available. - I have reviewed results of the MRI which were  available later  in the evening.  Some residual thickening at the site of previous disease in the rectum, marked interval improvement though residual disease not excluded.  Bulky tumor is no longer visible.  This is approximately 7.3 cm from the anal verge.  Low T2 signal associated with the area of extension beyond the rectal wall, potentially reflecting posttreatment related changes.  Slightly increased size of the dominant lymph node in the mesorectum, measuring 7 mm, previously 5-6 mm. - We will call and inform her of the results.  If she wishes to look at the pictures, she will come back to our office. - Given these findings, I would recommend repeat MRI in 3 months with blood work.   Orders placed this encounter:  No orders of the defined types were placed in this encounter.    Derek Jack, MD [place] [phone]   I, Milinda Antis, am acting as a scribe for Dr. Sanda Linger.  I, Derek Jack MD, have reviewed the above documentation for accuracy and completeness, and I agree with the above.

## 2020-10-21 ENCOUNTER — Ambulatory Visit
Admission: RE | Admit: 2020-10-21 | Discharge: 2020-10-21 | Disposition: A | Discharge status: Home / Self Care | Payer: Self-pay | Source: Ambulatory Visit | Attending: Hematology | Admitting: Hematology

## 2020-10-21 ENCOUNTER — Other Ambulatory Visit (HOSPITAL_COMMUNITY): Payer: Self-pay | Admitting: Hematology

## 2020-10-21 ENCOUNTER — Inpatient Hospital Stay (HOSPITAL_BASED_OUTPATIENT_CLINIC_OR_DEPARTMENT_OTHER): Payer: Medicare Other | Admitting: Hematology

## 2020-10-21 DIAGNOSIS — C20 Malignant neoplasm of rectum: Secondary | ICD-10-CM | POA: Diagnosis not present

## 2020-10-21 NOTE — Progress Notes (Signed)
Virtual Visit via Telephone Note  I connected with Teddi Badalamenti on 10/21/20 at  4:15 PM EDT by telephone and verified that I am speaking with the correct person using two identifiers.  Location: Patient: At home Provider: In office   I discussed the limitations, risks, security and privacy concerns of performing an evaluation and management service by telephone and the availability of in person appointments. I also discussed with the patient that there may be a patient responsible charge related to this service. The patient expressed understanding and agreed to proceed.   History of Present Illness: She is followed in the clinic with history of rectal cancer.  She was initially diagnosed in January 2021 when rectal mass biopsy showed high-grade dysplasia.  She was presumed to have rectal cancer based on imaging findings with MRI of the pelvis on 10/05/2019 showing low rectal mass, T3N2.  She has declined TNT and surgery.  She moved to [place] and received alternative treatments in the form of vitamin C infusions.   Observations/Objective: She does not report any change in bowels.  No bleeding per rectum reported.  No rectal pain.  Assessment and Plan:  1.  Stage III low rectal cancer: - We reviewed results of the MRI of the pelvis without contrast from 10/15/2020 which showed small residual thickening at the site of previous disease in the rectum, with bulky tumor no longer visible.  This is approximately 7.3 cm from anal verge.  More or less stable lymph nodes with 1 lymph node increased in size by 1 mm. - She was very happy with the results of the scan.  I have recommended follow-up in 3 months with repeat MRI of the pelvis without contrast, CEA, CBC and CMP. - We will consider abdominal imaging based on labs and CEA.   Follow Up Instructions: RTC 3 months for follow-up.   I discussed the assessment and treatment plan with the patient. The patient was provided an  opportunity to ask questions and all were answered. The patient agreed with the plan and demonstrated an understanding of the instructions.   The patient was advised to call back or seek an in-person evaluation if the symptoms worsen or if the condition fails to improve as anticipated.  I provided 11 minutes of non-face-to-face time during this encounter.   Derek Jack, MD

## 2020-10-22 ENCOUNTER — Other Ambulatory Visit (HOSPITAL_COMMUNITY): Payer: Self-pay

## 2020-10-22 DIAGNOSIS — C20 Malignant neoplasm of rectum: Secondary | ICD-10-CM

## 2020-10-27 DIAGNOSIS — E559 Vitamin D deficiency, unspecified: Secondary | ICD-10-CM | POA: Diagnosis not present

## 2020-10-27 DIAGNOSIS — C20 Malignant neoplasm of rectum: Secondary | ICD-10-CM | POA: Diagnosis not present

## 2020-10-27 DIAGNOSIS — M810 Age-related osteoporosis without current pathological fracture: Secondary | ICD-10-CM | POA: Diagnosis not present

## 2020-10-28 ENCOUNTER — Other Ambulatory Visit: Payer: Self-pay | Admitting: Family Medicine

## 2020-10-28 DIAGNOSIS — M810 Age-related osteoporosis without current pathological fracture: Secondary | ICD-10-CM

## 2020-10-29 ENCOUNTER — Other Ambulatory Visit (HOSPITAL_COMMUNITY): Payer: Self-pay | Admitting: *Deleted

## 2020-10-29 DIAGNOSIS — C20 Malignant neoplasm of rectum: Secondary | ICD-10-CM

## 2021-01-19 ENCOUNTER — Inpatient Hospital Stay (HOSPITAL_COMMUNITY): Payer: Medicare Other | Attending: Hematology

## 2021-01-19 ENCOUNTER — Ambulatory Visit (HOSPITAL_COMMUNITY)
Admission: RE | Admit: 2021-01-19 | Discharge: 2021-01-19 | Disposition: A | Discharge status: Home / Self Care | Payer: Medicare Other | Source: Ambulatory Visit | Attending: Hematology | Admitting: Hematology

## 2021-01-19 ENCOUNTER — Other Ambulatory Visit: Payer: Self-pay

## 2021-01-19 DIAGNOSIS — C20 Malignant neoplasm of rectum: Secondary | ICD-10-CM | POA: Insufficient documentation

## 2021-01-19 DIAGNOSIS — D490 Neoplasm of unspecified behavior of digestive system: Secondary | ICD-10-CM | POA: Diagnosis not present

## 2021-01-19 DIAGNOSIS — R590 Localized enlarged lymph nodes: Secondary | ICD-10-CM | POA: Diagnosis not present

## 2021-01-19 DIAGNOSIS — C189 Malignant neoplasm of colon, unspecified: Secondary | ICD-10-CM | POA: Diagnosis not present

## 2021-01-19 DIAGNOSIS — K6289 Other specified diseases of anus and rectum: Secondary | ICD-10-CM | POA: Diagnosis not present

## 2021-01-19 LAB — CBC WITH DIFFERENTIAL/PLATELET
Abs Immature Granulocytes: 0.03 10*3/uL (ref 0.00–0.07)
Basophils Absolute: 0 10*3/uL (ref 0.0–0.1)
Basophils Relative: 0 %
Eosinophils Absolute: 0.1 10*3/uL (ref 0.0–0.5)
Eosinophils Relative: 1 %
HCT: 43.6 % (ref 36.0–46.0)
Hemoglobin: 13.8 g/dL (ref 12.0–15.0)
Immature Granulocytes: 0 %
Lymphocytes Relative: 34 %
Lymphs Abs: 2.6 10*3/uL (ref 0.7–4.0)
MCH: 31.9 pg (ref 26.0–34.0)
MCHC: 31.7 g/dL (ref 30.0–36.0)
MCV: 100.7 fL — ABNORMAL HIGH (ref 80.0–100.0)
Monocytes Absolute: 0.7 10*3/uL (ref 0.1–1.0)
Monocytes Relative: 9 %
Neutro Abs: 4.2 10*3/uL (ref 1.7–7.7)
Neutrophils Relative %: 56 %
Platelets: 263 10*3/uL (ref 150–400)
RBC: 4.33 MIL/uL (ref 3.87–5.11)
RDW: 13.4 % (ref 11.5–15.5)
WBC: 7.6 10*3/uL (ref 4.0–10.5)
nRBC: 0 % (ref 0.0–0.2)

## 2021-01-19 LAB — COMPREHENSIVE METABOLIC PANEL
ALT: 17 U/L (ref 0–44)
AST: 21 U/L (ref 15–41)
Albumin: 3.8 g/dL (ref 3.5–5.0)
Alkaline Phosphatase: 77 U/L (ref 38–126)
Anion gap: 4 — ABNORMAL LOW (ref 5–15)
BUN: 15 mg/dL (ref 8–23)
CO2: 27 mmol/L (ref 22–32)
Calcium: 9.7 mg/dL (ref 8.9–10.3)
Chloride: 107 mmol/L (ref 98–111)
Creatinine, Ser: 0.53 mg/dL (ref 0.44–1.00)
GFR, Estimated: >60 mL/min (ref >60)
Glucose, Bld: 93 mg/dL (ref 70–99)
Potassium: 3.9 mmol/L (ref 3.5–5.1)
Sodium: 138 mmol/L (ref 135–145)
Total Bilirubin: 0.8 mg/dL (ref 0.3–1.2)
Total Protein: 6.8 g/dL (ref 6.5–8.1)

## 2021-01-20 LAB — CEA: CEA: 2.4 ng/mL (ref 0.0–4.7)

## 2021-01-24 NOTE — Progress Notes (Signed)
[place], [place] [address]   CLINIC:  Medical Oncology/Hematology  PCP:  Fanny Bien, MD [address] / [place] [address] [phone]   REASON FOR VISIT:  Follow-up for rectal cancer  PRIOR THERAPY: none  NGS Results: not done  CURRENT THERAPY: surveillance  CANCER STAGING: Cancer Staging No matching staging information was found for the patient.  INTERVAL HISTORY:  [name], a 65 y.o. female, returns for routine follow-up of her rectal cancer. Kathy Zuniga was last seen on 10/21/20.   Today she reports feeling good. She reports improved BM, good appetite, and 1 episode of bloody stool over 3 months. She is no longer on a vegan diet.   REVIEW OF SYSTEMS:  Review of Systems  Constitutional:  Negative for appetite change and fatigue (90%).  Gastrointestinal:  Positive for blood in stool. Negative for constipation and diarrhea.  Psychiatric/Behavioral:  The patient is nervous/anxious.   All other systems reviewed and are negative.  PAST MEDICAL/SURGICAL HISTORY:  Past Medical History:  Diagnosis Date   Arthritis    Back pain    Fracture of coccyx (HCC)    Frequent headaches    Migraines    No blood products 02/15/2017   Jehovah's Witness   Osteoporosis    Thyroid disease    Past Surgical History:  Procedure Laterality Date   WISDOM TOOTH EXTRACTION      SOCIAL HISTORY:  Social History   Socioeconomic History   Marital status: Married    Spouse name: Kathy Zuniga   Number of children: 3   Years of education: Not on file   Highest education level: Not on file  Occupational History   Occupation: unemployed  Tobacco Use   Smoking status: Never   Smokeless tobacco: Never  Vaping Use   Vaping Use: Never used  Substance and Sexual Activity   Alcohol use: Not Currently    Alcohol/week: 6.0 standard drinks    Types: 6 Standard drinks or equivalent per week    Comment: occas. wine    Drug use: No   Sexual activity: Not Currently    Birth control/protection: Post-menopausal  Other Topics Concern   Not on file  Social History Narrative   Work or School:Retired, used to do [place] with husband      Spiritual Beliefs:Jehovah's Witness      Lifestyle:walking 60 minutes daily and eating a healthy diet, does eat a lot of dairy   Social Determinants of Health   Financial Resource Strain: Low Risk    Difficulty of Paying Living Expenses: Not hard at all  Food Insecurity: No Food Insecurity   Worried About Charity fundraiser in the Last Year: Never true   Arboriculturist in the Last Year: Never true  Transportation Needs: No Transportation Needs   Lack of Transportation (Medical): No   Lack of Transportation (Non-Medical): No  Physical Activity: Insufficiently Active   Days of Exercise per Week: 4 days   Minutes of Exercise per Session: 30 min  Stress: No Stress Concern Present   Feeling of Stress : Not at all  Social Connections: Socially Integrated   Frequency of Communication with Friends and Family: More than three times a week   Frequency of Social Gatherings with Friends and Family: More than three times a week   Attends Religious Services: More than 4 times per year   Active Member  of Clubs or Organizations: Yes   Attends Archivist Meetings: Never   Marital Status: Married  Human resources officer Violence: Not At Risk   Fear of Current or Ex-Partner: No   Emotionally Abused: No   Physically Abused: No   Sexually Abused: No    FAMILY HISTORY:  Family History  Problem Relation Age of Onset   Osteoporosis Mother        Mom passed away from pathological fractures   Arthritis Mother    CVA Mother        TIA's   Colon cancer Father 48   Heart failure Father    Arthritis Father    Drug abuse Brother    Heart disease Paternal Grandfather    Hypothyroidism Daughter    Diabetes Mellitus II Daughter    Diabetes  Mellitus II Son     CURRENT MEDICATIONS:  Current Outpatient Medications  Medication Sig Dispense Refill   AMBULATORY NON FORMULARY MEDICATION Medication Name: Progesterone 12% cream Sig: Apply 1 ml to inner thigh nightly 30 mL 12   Cholecalciferol (VITAMIN D3) 5000 units TABS Take 5,000 each by mouth daily.      co-enzyme Q-10 30 MG capsule Take by mouth.     Cyanocobalamin (BL VITAMIN B-12 PO) Take by mouth.     MAGNESIUM MALATE PO Take by mouth.     Menaquinone-7 (VITAMIN K2 PO) Take by mouth.     OVER THE COUNTER MEDICATION Calcium magnesium citrate '300mg'$  daily     Probiotic Product (PROBIOTIC DAILY PO) Take 1 tablet by mouth daily.     No current facility-administered medications for this visit.    ALLERGIES:  Allergies  Allergen Reactions   Almond Meal    Asa [Aspirin] Other (See Comments)    GI bleed Gi bleed    Crab [Shellfish Allergy]     clams   Ibuprofen Other (See Comments)    GI bleed   Shellfish-Derived Products     clams   Spinach    Tomato Other (See Comments)   Other     Green Beans, zucchini, squash    PHYSICAL EXAM:  Performance status (ECOG): 0 - Asymptomatic  Vitals:   01/26/21 1324  BP: (!) 108/49  Pulse: 83  Resp: 18  Temp: (!) 96.9 F (36.1 C)  SpO2: 99%   Wt Readings from Last 3 Encounters:  01/26/21 127 lb 12.8 oz (58 kg)  10/20/20 121 lb 4.8 oz (55 kg)  10/01/20 122 lb 3.2 oz (55.4 kg)   Physical Exam Vitals reviewed.  Constitutional:      Appearance: Normal appearance.  Cardiovascular:     Rate and Rhythm: Normal rate and regular rhythm.     Pulses: Normal pulses.     Heart sounds: Normal heart sounds.  Pulmonary:     Effort: Pulmonary effort is normal.     Breath sounds: Normal breath sounds.  Abdominal:     Palpations: Abdomen is soft. There is no hepatomegaly, splenomegaly or mass.     Tenderness: There is no abdominal tenderness.  Lymphadenopathy:     Lower Body: No right inguinal adenopathy. No left inguinal  adenopathy.  Neurological:     General: No focal deficit present.     Mental Status: She is alert and oriented to person, place, and time.  Psychiatric:        Mood and Affect: Mood normal.        Behavior: Behavior normal.     LABORATORY DATA:  I  have reviewed the labs as listed.  CBC Latest Ref Rng & Units 01/19/2021 10/01/2020 09/29/2017  WBC 4.0 - 10.5 K/uL 7.6 6.8 7.3  Hemoglobin 12.0 - 15.0 g/dL 13.8 14.0 13.6  Hematocrit 36.0 - 46.0 % 43.6 44.0 40.4  Platelets 150 - 400 K/uL 263 277 284   CMP Latest Ref Rng & Units 01/19/2021 10/01/2020 09/29/2017  Glucose 70 - 99 mg/dL 93 93 130(H)  BUN 8 - 23 mg/dL '15 10 14  '$ Creatinine 0.44 - 1.00 mg/dL 0.53 0.48 0.57  Sodium 135 - 145 mmol/L 138 138 137  Potassium 3.5 - 5.1 mmol/L 3.9 3.8 4.0  Chloride 98 - 111 mmol/L 107 105 106  CO2 22 - 32 mmol/L '27 25 26  '$ Calcium 8.9 - 10.3 mg/dL 9.7 10.2 9.7  Total Protein 6.5 - 8.1 g/dL 6.8 7.3 7.0  Total Bilirubin 0.3 - 1.2 mg/dL 0.8 0.7 0.5  Alkaline Phos 38 - 126 U/L 77 70 55  AST 15 - 41 U/L '21 18 23  '$ ALT 0 - 44 U/L '17 17 20    '$ DIAGNOSTIC IMAGING:  I have independently reviewed the scans and discussed with the patient. MR Pelvis Wo Contrast  Result Date: 01/20/2021 CLINICAL DATA:  Colorectal cancer surveillance. EXAM: MRI PELVIS WITHOUT CONTRAST TECHNIQUE: Multiplanar multisequence MR imaging of the pelvis was performed. No intravenous contrast was administered. Ultrasound gel was administered per rectum to optimize tumor evaluation. COMPARISON:  Exam from October 15, 2020 and previous outside MRI of October 05, 2019. FINDINGS: TUMOR LOCATION Tumor distance from Anal Verge/Skin Surface:  7.5-8 cm Tumor distance to Internal Anal Sphincter: 3.5-4 cm Thickening along the RIGHT lateral rectum with distortion of the rectum is noted and better evaluated than on the previous exam but still limited by the presence of rectal spasm some increased thickening is noted compared to the study of October 15, 2020 along the  RIGHT lateral rectal wall measuring approximately 10 mm greatest thickness, increased compared to the most recent study and improved still when compared to the prior exam (image 29/9) distortion of the rectal wall at this level is noted with "puckering" of the rectum at the level of tumor very subtle but apparent on the study of October 15, 2020. Perhaps 7 mm on the previous exam at this location. TUMOR DESCRIPTION Circumferential Extent: Eccentric approximately 160 degrees involvement of the circumference of the rectum tracking from approximately the 7 o'clock position to the 12 o'clock position. Tumor Length: 3 cm T - CATEGORY Extension through Muscularis Propria: Yes, approximately 3-4 mm extension beyond the muscularis (image 29/9). Shortest Distance of any tumor/node from Mesorectal Fascia: 3 mm for tumor and a 5 mm lymph node on image 19 of series 9 within 2 mm of the mesorectal fascia. Extramural Vascular Invasion/Tumor Thrombus: No Invasion of Anterior Peritoneal Reflection: No Involvement of Adjacent Organs or Pelvic Sidewall: No Levator Ani Involvement: No N - CATEGORY Mesorectal Lymph Nodes >=20m: N2, as before largest node approximately 7 mm. Lymph nodes localized to the RIGHT mesorectum. Small lymph nodes along the superior rectal vein still regional largest at 6 mm (image 15/9) Extra-mesorectal Lymphadenopathy: Low superior rectal lymph node (image 15/9 6 mm. No pelvic sidewall lymphadenopathy Other: Urinary bladder with smooth contours. No bladder wall thickening or perivesical stranding. IMPRESSION: 1. Slight increase in rectal thickening and distortion of the RIGHT rectal wall at the site of previous primary tumor suspicious for some disease worsening at this location. T3 B by imaging currently. Correlation with direct visualization  may be helpful if not recently performed. (Remains improved when compared to the initial imaging study from April of 2021 but is potentially slightly increased compared to  the study of April of 2022). 2. N2 disease with superior rectal and mesorectal lymph nodes which are similar to the prior study. 3. No pelvic sidewall lymphadenopathy. Electronically Signed   By: Zetta Bills M.D.   On: 01/20/2021 10:34     ASSESSMENT:  1.  Stage III (T3N2) rectal cancer: -Initially diagnosed on 07/10/2019 with colonoscopy and biopsy of the rectal mass showing high-grade dysplasia. -MRI of the pelvis on 10/05/2019 showed low rectal mass, T3N2.  CEA was 2.4. -CT chest on 07/18/2019 showed multiple right upper lobe pleural-based nodules, nonspecific. -Even though biopsy was high-grade dysplasia, because of MRI findings, thought to be rectal cancer. -She declined TNT and surgery. -She moved to [place] and received alternative treatments in the form of vitamin C infusions. -She reported improvement in bleeding and melena.  She does not report any rectal pain. -She reported 30 pound weight loss since diagnosis but did dietary changes and converted to vegan. - Rectal exam at initial visit showed some thickening of posterior wall of the rectum with no clear mass. - MRI of the abdomen on 10/15/2020 with no evidence of metastatic disease. - MRI of the pelvis without contrast on 10/15/2020 showed residual thickening at the site of previous disease in the rectum with interval improvement approximately 7.3 cm from anal verge.  5 mm node in the right mesorectum with irregular margins.  7 mm superior rectal lymph node increased by 1 mm compared to MRI from April 2021.   2.  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 -She is a non-smoker.  She is reportedly Jehovah's Witness from previous chart. -Father had colon cancer.   PLAN:  1.  Stage III (T3N2) low rectal cancer: -She had 1 episode of bleeding per rectum in the last 3 months.  She reports that she has a history of hemorrhoids.  Otherwise she has been feeling well.  Denies any new onset  pains. - Reviewed her labs today which showed normal LFTs and CBC.  CEA was 2.4. - Reviewed results of the MRI of the pelvis without contrast from 01/19/2021 which showed thickening of the right lateral rectum, slightly increased measuring approximately 10 mm, previously 7 mm.  Superior rectal and mesorectal lymph nodes are similar compared to MRI from April 2022. - I have recommended sigmoidoscopy and biopsy of the lesion.  We will make referral to GI. - RTC 4 months with repeat imaging and CEA.   Orders placed this encounter:  No orders of the defined types were placed in this encounter.    Derek Jack, MD [place] [phone]   I, Thana Ates, am acting as a scribe for Dr. Derek Jack.  I, Derek Jack MD, have reviewed the above documentation for accuracy and completeness, and I agree with the above.

## 2021-01-26 ENCOUNTER — Other Ambulatory Visit: Payer: Self-pay

## 2021-01-26 ENCOUNTER — Inpatient Hospital Stay (HOSPITAL_COMMUNITY): Payer: Medicare Other | Attending: Hematology | Admitting: Hematology

## 2021-01-26 ENCOUNTER — Ambulatory Visit (HOSPITAL_COMMUNITY): Payer: Medicare Other | Admitting: Hematology

## 2021-01-26 VITALS — BP 108/49 | HR 83 | Temp 96.9°F | Resp 18 | Wt 127.8 lb

## 2021-01-26 DIAGNOSIS — C20 Malignant neoplasm of rectum: Secondary | ICD-10-CM | POA: Insufficient documentation

## 2021-01-26 NOTE — Patient Instructions (Addendum)
[place] at [facility] Discharge Instructions  You were seen today by Dr. Delton Coombes. He went over your recent results and scans. You have been referred to Dr. Jenetta Downer for a sigmoidoscopy. You will be scheduled for an MRI scan of your pelvis prior to your next appointment. Dr. Delton Coombes will see you back in 4 months for labs and follow up.   Thank you for choosing [place] at [facility] to provide your oncology and hematology care.  To afford each patient quality time with our provider, please arrive at least 15 minutes before your scheduled appointment time.   If you have a lab appointment with the [facility] please come in thru the Main Entrance and check in at the main information desk  You need to re-schedule your appointment should you arrive 10 or more minutes late.  We strive to give you quality time with our providers, and arriving late affects you and other patients whose appointments are after yours.  Also, if you no show three or more times for appointments you may be dismissed from the clinic at the providers discretion.     Again, thank you for choosing [facility].  Our hope is that these requests will decrease the amount of time that you wait before being seen by our physicians.       _____________________________________________________________  Should you have questions after your visit to [facility], please contact our office at [phone] between the hours of 8:00 a.m. and 4:30 p.m.  Voicemails left after 4:00 p.m. will not be returned until the following business day.  For prescription refill requests, have your pharmacy contact our office and allow 72 hours.    Cancer Center Support Programs:   > Cancer Support Group  2nd Tuesday of the month 1pm-2pm, Journey Room

## 2021-01-27 ENCOUNTER — Other Ambulatory Visit (HOSPITAL_COMMUNITY): Payer: Self-pay | Admitting: *Deleted

## 2021-01-27 ENCOUNTER — Encounter (INDEPENDENT_AMBULATORY_CARE_PROVIDER_SITE_OTHER): Payer: Self-pay | Admitting: *Deleted

## 2021-01-27 DIAGNOSIS — C20 Malignant neoplasm of rectum: Secondary | ICD-10-CM

## 2021-01-27 NOTE — Addendum Note (Signed)
Addended by: Renda Rolls A on: 01/27/2021 08:24 AM   Modules accepted: Orders

## 2021-03-05 DIAGNOSIS — M810 Age-related osteoporosis without current pathological fracture: Secondary | ICD-10-CM | POA: Diagnosis not present

## 2021-03-05 DIAGNOSIS — Z531 Procedure and treatment not carried out because of patient's decision for reasons of belief and group pressure: Secondary | ICD-10-CM | POA: Diagnosis not present

## 2021-03-05 DIAGNOSIS — R030 Elevated blood-pressure reading, without diagnosis of hypertension: Secondary | ICD-10-CM | POA: Diagnosis not present

## 2021-03-05 DIAGNOSIS — C20 Malignant neoplasm of rectum: Secondary | ICD-10-CM | POA: Diagnosis not present

## 2021-03-12 ENCOUNTER — Encounter (HOSPITAL_COMMUNITY): Payer: Self-pay

## 2021-03-13 NOTE — Telephone Encounter (Signed)
Patient states that symptoms are beginning to resolve.  Will touch base with her on Wednesday next week and notify you if she continues to have issues.

## 2021-04-10 ENCOUNTER — Other Ambulatory Visit: Payer: Self-pay

## 2021-04-10 ENCOUNTER — Ambulatory Visit
Admission: RE | Admit: 2021-04-10 | Discharge: 2021-04-10 | Disposition: A | Discharge status: Home / Self Care | Payer: Medicare Other | Source: Ambulatory Visit | Attending: Family Medicine | Admitting: Family Medicine

## 2021-04-10 DIAGNOSIS — M810 Age-related osteoporosis without current pathological fracture: Secondary | ICD-10-CM

## 2021-04-10 DIAGNOSIS — Z780 Asymptomatic menopausal state: Secondary | ICD-10-CM | POA: Diagnosis not present

## 2021-04-27 ENCOUNTER — Other Ambulatory Visit: Payer: Self-pay

## 2021-04-27 ENCOUNTER — Encounter (INDEPENDENT_AMBULATORY_CARE_PROVIDER_SITE_OTHER): Payer: Self-pay | Admitting: Gastroenterology

## 2021-04-27 ENCOUNTER — Other Ambulatory Visit (INDEPENDENT_AMBULATORY_CARE_PROVIDER_SITE_OTHER): Payer: Self-pay

## 2021-04-27 ENCOUNTER — Encounter (INDEPENDENT_AMBULATORY_CARE_PROVIDER_SITE_OTHER): Payer: Self-pay

## 2021-04-27 ENCOUNTER — Ambulatory Visit (INDEPENDENT_AMBULATORY_CARE_PROVIDER_SITE_OTHER): Payer: Medicare Other | Admitting: Gastroenterology

## 2021-04-27 DIAGNOSIS — C20 Malignant neoplasm of rectum: Secondary | ICD-10-CM | POA: Diagnosis not present

## 2021-04-27 NOTE — Patient Instructions (Signed)
Schedule flexible sigmoidoscopy Referral to [facility] for discussion surgery for rectal cancer

## 2021-04-27 NOTE — H&P (View-Only) (Signed)
Maylon Peppers, M.D. Gastroenterology & Hepatology [facility] [address] [place], [place] [address] Primary Care Physician: Fanny Bien, MD [place] [address]  Referring MD: Derek Jack, MD  Chief Complaint: Rectal bleeding and history of rectal cancer  History of Present Illness: Kathy Zuniga is a 65 y.o. female with PMH rectal cancer T3N2M0, who presents for evaluation of rectal bleeding and rectal cancer.  Patient underwent a colonoscopy on 07/08/2019 at ?[facility] for evaluation of rectal bleeding and discomfort.  She was found to have findings described below with presence of an adenomatous polyp in the rectum with significant high-grade dysplasia but concerning for malignancy given ulceration.  Due to this she underwent MRI of the pelvis with and without IV contrast which showed involvement of the muscularis propria and extension to lymph nodes (at least 3 lymph nodes involved).  The patient was seen by at least 3 surgeons who recommended management by an interdisciplinary group.  However, the patient reports that she was not interested in undergoing chemoradiation because she was not interested in having potential side effects of chemotherapy and she will wanted to pursue alternative treatments to decrease the size of the tumor prior to undergoing surgical resection.  Due to this, the patient received vitamin C infusions, oral supplements and diet changes as part of the treatment in [place].  The patient has been followed with Dr. Delton Coombes in the clinic.  Her most recent MRI of the pelvis without IV contrast was performed 01/19/2021 which showed slight increase in rectal thickening and distortion of the right rectal wall which was suspicious for worsening disease in this location.  This corresponded to T3 staging with N2 involvement of the superior rectal and mesorectal lymph nodes (this is  stable compared to prior). Patient states feeling well, but states 6 weeks she presented some rectal bleeding and some rectal pressure. This improved on its own. The patient denies having any nausea, vomiting, fever, chills, melena, hematemesis, abdominal distention, abdominal pain, diarrhea, jaundice, pruritus or weight loss.  Last Colonoscopy:06/2019 Colonoscopy - complete to terminal ileum. An ulcerated, non-obstructing mass was found in the rectum, partially circumferential (involving 1/3 of the lumen). 23 mm in size, biopsied and tattooed 3-4 cm proximal, two sessile polyps removed from the ascending colon and cecum, 3 and 6 mm polyps removed from the transverse colon, internal hemorrhoids. Path: adenomatous polyps in the colon.  Pathology: Tubulovillous adenoma with extensive high grade dysplasia.  FHx: neg for any gastrointestinal/liver disease, father colon cancer in late 55s Social: neg smoking, alcohol or illicit drug use Surgical: no abdominal surgeries  Past Medical History: Past Medical History:  Diagnosis Date   Arthritis    Back pain    Fracture of coccyx ([place])    Frequent headaches    Migraines    No blood products 02/15/2017   Jehovah's Witness   Osteoporosis    Thyroid disease     Past Surgical History: Past Surgical History:  Procedure Laterality Date   WISDOM TOOTH EXTRACTION      Family History: Family History  Problem Relation Age of Onset   Osteoporosis Mother        Mom passed away from pathological fractures   Arthritis Mother    CVA Mother        TIA's   Colon cancer Father 76   Heart failure Father    Arthritis Father    Drug abuse Brother    Heart disease Paternal  Grandfather    Hypothyroidism Daughter    Diabetes Mellitus II Daughter    Diabetes Mellitus II Son     Social History: Social History   Tobacco Use  Smoking Status Never  Smokeless Tobacco Never   Social History   Substance and Sexual Activity  Alcohol Use Yes    Alcohol/week: 6.0 standard drinks   Types: 6 Standard drinks or equivalent per week   Comment: occas. wine   Social History   Substance and Sexual Activity  Drug Use No    Allergies: Allergies  Allergen Reactions   Almond Meal    Asa [Aspirin] Other (See Comments)    GI bleed Gi bleed    Crab [Shellfish Allergy]     clams   Ibuprofen Other (See Comments)    GI bleed   Shellfish-Derived Products     clams   Spinach    Tomato Other (See Comments)   Other     Green Beans, zucchini, squash    Medications: Current Outpatient Medications  Medication Sig Dispense Refill   AMBULATORY NON FORMULARY MEDICATION Medication Name: Progesterone 12% cream Sig: Apply 1 ml to inner thigh nightly 30 mL 12   Cholecalciferol (VITAMIN D3) 5000 units TABS Take 5,000 each by mouth daily.      co-enzyme Q-10 30 MG capsule Take by mouth.     Cyanocobalamin (BL VITAMIN B-12 PO) Take by mouth.     MAGNESIUM MALATE PO Take by mouth.     Menaquinone-7 (VITAMIN K2 PO) Take by mouth.     OVER THE COUNTER MEDICATION Calcium magnesium citrate 300mg  daily     Probiotic Product (PROBIOTIC DAILY PO) Take 1 tablet by mouth daily.     No current facility-administered medications for this visit.    Review of Systems: GENERAL: negative for malaise, night sweats HEENT: No changes in hearing or vision, no nose bleeds or other nasal problems. NECK: Negative for lumps, goiter, pain and significant neck swelling RESPIRATORY: Negative for cough, wheezing CARDIOVASCULAR: Negative for chest pain, leg swelling, palpitations, orthopnea GI: SEE HPI MUSCULOSKELETAL: Negative for joint pain or swelling, back pain, and muscle pain. SKIN: Negative for lesions, rash PSYCH: Negative for sleep disturbance, mood disorder and recent psychosocial stressors. HEMATOLOGY Negative for prolonged bleeding, bruising easily, and swollen nodes. ENDOCRINE: Negative for cold or heat intolerance, polyuria, polydipsia and  goiter. NEURO: negative for tremor, gait imbalance, syncope and seizures. The remainder of the review of systems is noncontributory.   Physical Exam: BP 119/76 (BP Location: Left Arm, Patient Position: Sitting, Cuff Size: Large)   Pulse 66   Temp 97.6 F (36.4 C) (Oral)   Ht 5\' 4"  (1.626 m)   Wt 132 lb (59.9 kg)   BMI 22.66 kg/m  GENERAL: The patient is AO x3, in no acute distress. HEENT: Head is normocephalic and atraumatic. EOMI are intact. Mouth is well hydrated and without lesions. NECK: Supple. No masses LUNGS: Clear to auscultation. No presence of rhonchi/wheezing/rales. Adequate chest expansion HEART: RRR, normal s1 and s2. ABDOMEN: Soft, nontender, no guarding, no peritoneal signs, and nondistended. BS +. No masses. EXTREMITIES: Without any cyanosis, clubbing, rash, lesions or edema. NEUROLOGIC: AOx3, no focal motor deficit. SKIN: no jaundice, no rashes   Imaging/Labs: as above  I personally reviewed and interpreted the available labs, imaging and endoscopic files.  Impression and Plan: Kathy Zuniga is a 65 y.o. female with PMH rectal cancer T3N2M0, who presents for evaluation of rectal bleeding and rectal cancer.  The patient  has received alternative treatments for rectal cancer but has not pursue gold standard treatment which includes chemoradiation and surgical approach for her cancer.  Even though she had biopsies showing only dysplastic changes, there was presence of ulceration in the mass along with extension to the muscularis propria and lymph node involvement which were consistent with rectal cancer.  I had an extensive discussion with the patient regarding her current disease and the available treatments for it.  Explained to her that I will be more than happy to repeat a flexible sigmoidoscopy, but even if it only shows still dysplastic changes, her imaging results are consistent with a malignancy in her rectum.  She is not interested in pursuing any type of  chemoradiation but will like to discuss possible surgical exclusive options with a surgeon.  I will refer her to [facility] surgery to discuss this further but I explained to her that the standard of treatment was using the chemoradiation.  She would like to continue using other alternative treatments to achieve tumor shrinking prior to a potential surgery.  Ultimately, once she receives definitive therapy, she would require flexible sigmoidoscopies at 6, 18 and 24 months, and full colonoscopy a year after resection.  - Schedule flexible sigmoidoscopy - Referral to [facility] Surgery for discussion surgery for rectal cancer  All questions were answered.      Total face to face time during this encounter 45 minutes  Maylon Peppers, MD Gastroenterology and Hepatology [facility]

## 2021-04-27 NOTE — Progress Notes (Signed)
Kathy Zuniga, M.D. Gastroenterology & Hepatology [facility] [address] [place], [place] [address] Primary Care Physician: Fanny Bien, MD [place] [address]  Referring MD: Derek Jack, MD  Chief Complaint: Rectal bleeding and history of rectal cancer  History of Present Illness: Kathy Zuniga is a 65 y.o. female with PMH rectal cancer T3N2M0, who presents for evaluation of rectal bleeding and rectal cancer.  Patient underwent a colonoscopy on 07/08/2019 at ?[facility] for evaluation of rectal bleeding and discomfort.  She was found to have findings described below with presence of an adenomatous polyp in the rectum with significant high-grade dysplasia but concerning for malignancy given ulceration.  Due to this she underwent MRI of the pelvis with and without IV contrast which showed involvement of the muscularis propria and extension to lymph nodes (at least 3 lymph nodes involved).  The patient was seen by at least 3 surgeons who recommended management by an interdisciplinary group.  However, the patient reports that she was not interested in undergoing chemoradiation because she was not interested in having potential side effects of chemotherapy and she will wanted to pursue alternative treatments to decrease the size of the tumor prior to undergoing surgical resection.  Due to this, the patient received vitamin C infusions, oral supplements and diet changes as part of the treatment in [place].  The patient has been followed with Dr. Delton Coombes in the clinic.  Her most recent MRI of the pelvis without IV contrast was performed 01/19/2021 which showed slight increase in rectal thickening and distortion of the right rectal wall which was suspicious for worsening disease in this location.  This corresponded to T3 staging with N2 involvement of the superior rectal and mesorectal lymph nodes (this is  stable compared to prior). Patient states feeling well, but states 6 weeks she presented some rectal bleeding and some rectal pressure. This improved on its own. The patient denies having any nausea, vomiting, fever, chills, melena, hematemesis, abdominal distention, abdominal pain, diarrhea, jaundice, pruritus or weight loss.  Last Colonoscopy:06/2019 Colonoscopy - complete to terminal ileum. An ulcerated, non-obstructing mass was found in the rectum, partially circumferential (involving 1/3 of the lumen). 23 mm in size, biopsied and tattooed 3-4 cm proximal, two sessile polyps removed from the ascending colon and cecum, 3 and 6 mm polyps removed from the transverse colon, internal hemorrhoids. Path: adenomatous polyps in the colon.  Pathology: Tubulovillous adenoma with extensive high grade dysplasia.  FHx: neg for any gastrointestinal/liver disease, father colon cancer in late 33s Social: neg smoking, alcohol or illicit drug use Surgical: no abdominal surgeries  Past Medical History: Past Medical History:  Diagnosis Date   Arthritis    Back pain    Fracture of coccyx ([place])    Frequent headaches    Migraines    No blood products 02/15/2017   J[employer]   Osteoporosis    Thyroid disease     Past Surgical History: Past Surgical History:  Procedure Laterality Date   WISDOM TOOTH EXTRACTION      Family History: Family History  Problem Relation Age of Onset   Osteoporosis Mother        Mom passed away from pathological fractures   Arthritis Mother    CVA Mother        TIA's   Colon cancer Father 10   Heart failure Father    Arthritis Father    Drug abuse Brother    Heart disease Paternal  Grandfather    Hypothyroidism Daughter    Diabetes Mellitus II Daughter    Diabetes Mellitus II Son     Social History: Social History   Tobacco Use  Smoking Status Never  Smokeless Tobacco Never   Social History   Substance and Sexual Activity  Alcohol Use Yes    Alcohol/week: 6.0 standard drinks   Types: 6 Standard drinks or equivalent per week   Comment: occas. wine   Social History   Substance and Sexual Activity  Drug Use No    Allergies: Allergies  Allergen Reactions   Almond Meal    Asa [Aspirin] Other (See Comments)    GI bleed Gi bleed    Crab [Shellfish Allergy]     clams   Ibuprofen Other (See Comments)    GI bleed   Shellfish-Derived Products     clams   Spinach    Tomato Other (See Comments)   Other     Green Beans, zucchini, squash    Medications: Current Outpatient Medications  Medication Sig Dispense Refill   AMBULATORY NON FORMULARY MEDICATION Medication Name: Progesterone 12% cream Sig: Apply 1 ml to inner thigh nightly 30 mL 12   Cholecalciferol (VITAMIN D3) 5000 units TABS Take 5,000 each by mouth daily.      co-enzyme Q-10 30 MG capsule Take by mouth.     Cyanocobalamin (BL VITAMIN B-12 PO) Take by mouth.     MAGNESIUM MALATE PO Take by mouth.     Menaquinone-7 (VITAMIN K2 PO) Take by mouth.     OVER THE COUNTER MEDICATION Calcium magnesium citrate 300mg  daily     Probiotic Product (PROBIOTIC DAILY PO) Take 1 tablet by mouth daily.     No current facility-administered medications for this visit.    Review of Systems: GENERAL: negative for malaise, night sweats HEENT: No changes in hearing or vision, no nose bleeds or other nasal problems. NECK: Negative for lumps, goiter, pain and significant neck swelling RESPIRATORY: Negative for cough, wheezing CARDIOVASCULAR: Negative for chest pain, leg swelling, palpitations, orthopnea GI: SEE HPI MUSCULOSKELETAL: Negative for joint pain or swelling, back pain, and muscle pain. SKIN: Negative for lesions, rash PSYCH: Negative for sleep disturbance, mood disorder and recent psychosocial stressors. HEMATOLOGY Negative for prolonged bleeding, bruising easily, and swollen nodes. ENDOCRINE: Negative for cold or heat intolerance, polyuria, polydipsia and  goiter. NEURO: negative for tremor, gait imbalance, syncope and seizures. The remainder of the review of systems is noncontributory.   Physical Exam: BP 119/76 (BP Location: Left Arm, Patient Position: Sitting, Cuff Size: Large)   Pulse 66   Temp 97.6 F (36.4 C) (Oral)   Ht 5\' 4"  (1.626 m)   Wt 132 lb (59.9 kg)   BMI 22.66 kg/m  GENERAL: The patient is AO x3, in no acute distress. HEENT: Head is normocephalic and atraumatic. EOMI are intact. Mouth is well hydrated and without lesions. NECK: Supple. No masses LUNGS: Clear to auscultation. No presence of rhonchi/wheezing/rales. Adequate chest expansion HEART: RRR, normal s1 and s2. ABDOMEN: Soft, nontender, no guarding, no peritoneal signs, and nondistended. BS +. No masses. EXTREMITIES: Without any cyanosis, clubbing, rash, lesions or edema. NEUROLOGIC: AOx3, no focal motor deficit. SKIN: no jaundice, no rashes   Imaging/Labs: as above  I personally reviewed and interpreted the available labs, imaging and endoscopic files.  Impression and Plan: JONAI WEYLAND is a 65 y.o. female with PMH rectal cancer T3N2M0, who presents for evaluation of rectal bleeding and rectal cancer.  The patient  has received alternative treatments for rectal cancer but has not pursue gold standard treatment which includes chemoradiation and surgical approach for her cancer.  Even though she had biopsies showing only dysplastic changes, there was presence of ulceration in the mass along with extension to the muscularis propria and lymph node involvement which were consistent with rectal cancer.  I had an extensive discussion with the patient regarding her current disease and the available treatments for it.  Explained to her that I will be more than happy to repeat a flexible sigmoidoscopy, but even if it only shows still dysplastic changes, her imaging results are consistent with a malignancy in her rectum.  She is not interested in pursuing any type of  chemoradiation but will like to discuss possible surgical exclusive options with a surgeon.  I will refer her to [facility] surgery to discuss this further but I explained to her that the standard of treatment was using the chemoradiation.  She would like to continue using other alternative treatments to achieve tumor shrinking prior to a potential surgery.  Ultimately, once she receives definitive therapy, she would require flexible sigmoidoscopies at 6, 18 and 24 months, and full colonoscopy a year after resection.  - Schedule flexible sigmoidoscopy - Referral to [facility] Surgery for discussion surgery for rectal cancer  All questions were answered.      Total face to face time during this encounter 45 minutes  Kathy Peppers, MD Gastroenterology and Hepatology [facility]

## 2021-05-11 ENCOUNTER — Other Ambulatory Visit: Payer: Self-pay | Admitting: General Surgery

## 2021-05-11 ENCOUNTER — Other Ambulatory Visit (HOSPITAL_COMMUNITY): Payer: Self-pay | Admitting: General Surgery

## 2021-05-11 DIAGNOSIS — C20 Malignant neoplasm of rectum: Secondary | ICD-10-CM

## 2021-05-13 ENCOUNTER — Other Ambulatory Visit: Payer: Self-pay

## 2021-05-13 ENCOUNTER — Ambulatory Visit (HOSPITAL_COMMUNITY)
Admission: RE | Admit: 2021-05-13 | Discharge: 2021-05-13 | Disposition: A | Discharge status: Home / Self Care | Payer: Medicare Other | Attending: Gastroenterology | Admitting: Gastroenterology

## 2021-05-13 ENCOUNTER — Encounter (HOSPITAL_COMMUNITY)
Admission: RE | Disposition: A | Discharge status: Home / Self Care | Payer: Self-pay | Source: Home / Self Care | Attending: Gastroenterology

## 2021-05-13 ENCOUNTER — Ambulatory Visit (HOSPITAL_COMMUNITY): Payer: Medicare Other | Admitting: Anesthesiology

## 2021-05-13 ENCOUNTER — Encounter (HOSPITAL_COMMUNITY): Payer: Self-pay | Admitting: Gastroenterology

## 2021-05-13 DIAGNOSIS — Z85048 Personal history of other malignant neoplasm of rectum, rectosigmoid junction, and anus: Secondary | ICD-10-CM

## 2021-05-13 DIAGNOSIS — K921 Melena: Secondary | ICD-10-CM | POA: Diagnosis not present

## 2021-05-13 DIAGNOSIS — K648 Other hemorrhoids: Secondary | ICD-10-CM | POA: Diagnosis not present

## 2021-05-13 DIAGNOSIS — K219 Gastro-esophageal reflux disease without esophagitis: Secondary | ICD-10-CM | POA: Diagnosis not present

## 2021-05-13 DIAGNOSIS — C188 Malignant neoplasm of overlapping sites of colon: Secondary | ICD-10-CM

## 2021-05-13 DIAGNOSIS — D128 Benign neoplasm of rectum: Secondary | ICD-10-CM | POA: Diagnosis not present

## 2021-05-13 HISTORY — PX: BIOPSY: SHX5522

## 2021-05-13 HISTORY — PX: FLEXIBLE SIGMOIDOSCOPY: SHX5431

## 2021-05-13 SURGERY — SIGMOIDOSCOPY, FLEXIBLE
Anesthesia: General

## 2021-05-13 MED ORDER — PROPOFOL 10 MG/ML IV BOLUS
INTRAVENOUS | Status: AC
  Filled 2021-05-13: qty 40

## 2021-05-13 MED ORDER — PROPOFOL 10 MG/ML IV BOLUS
INTRAVENOUS | Status: AC
  Filled 2021-05-13: qty 20

## 2021-05-13 MED ORDER — PROPOFOL 500 MG/50ML IV EMUL
INTRAVENOUS | Status: DC | PRN
  Administered 2021-05-13: 150 ug/kg/min via INTRAVENOUS

## 2021-05-13 MED ORDER — LACTATED RINGERS IV SOLN
INTRAVENOUS | Status: DC
  Administered 2021-05-13: 1000 mL via INTRAVENOUS

## 2021-05-13 NOTE — Anesthesia Preprocedure Evaluation (Signed)
Anesthesia Evaluation  Patient identified by MRN, date of birth, ID band Patient awake    Reviewed: Allergy & Precautions, H&P , NPO status , Patient's Chart, lab work & pertinent test results, reviewed documented beta blocker date and time   Airway Mallampati: II  TM Distance: >3 FB Neck ROM: full    Dental no notable dental hx.    Pulmonary neg pulmonary ROS,    Pulmonary exam normal breath sounds clear to auscultation       Cardiovascular Exercise Tolerance: Good negative cardio ROS   Rhythm:regular Rate:Normal     Neuro/Psych  Headaches, negative psych ROS   GI/Hepatic Neg liver ROS, GERD  Medicated,  Endo/Other  negative endocrine ROS  Renal/GU negative Renal ROS  negative genitourinary   Musculoskeletal   Abdominal   Peds  Hematology  (+) REFUSES BLOOD PRODUCTS, JEHOVAH'S WITNESS  Anesthesia Other Findings   Reproductive/Obstetrics negative OB ROS                             Anesthesia Physical Anesthesia Plan  ASA: 2  Anesthesia Plan: General   Post-op Pain Management:    Induction:   PONV Risk Score and Plan: Propofol infusion  Airway Management Planned:   Additional Equipment:   Intra-op Plan:   Post-operative Plan:   Informed Consent: I have reviewed the patients History and Physical, chart, labs and discussed the procedure including the risks, benefits and alternatives for the proposed anesthesia with the patient or authorized representative who has indicated his/her understanding and acceptance.     Dental Advisory Given  Plan Discussed with: CRNA  Anesthesia Plan Comments:         Anesthesia Quick Evaluation

## 2021-05-13 NOTE — Discharge Instructions (Addendum)
You are being discharged to home.  Resume your previous diet.  We are waiting for your pathology results.  Can take Miralax on daily basis to soften bowel movements and avoid bleeding episodes.

## 2021-05-13 NOTE — Anesthesia Postprocedure Evaluation (Signed)
Anesthesia Post Note  Patient: Kathy Zuniga  Procedure(s) Performed: FLEXIBLE SIGMOIDOSCOPY BIOPSY  Patient location during evaluation: Phase II Anesthesia Type: General Level of consciousness: awake Pain management: pain level controlled Vital Signs Assessment: post-procedure vital signs reviewed and stable Respiratory status: spontaneous breathing and respiratory function stable Cardiovascular status: blood pressure returned to baseline and stable Postop Assessment: no headache and no apparent nausea or vomiting Anesthetic complications: no Comments: Late entry   No notable events documented.   Last Vitals:  Vitals:   05/13/21 1243 05/13/21 1248  BP: (!) 92/43 110/60  Pulse: (!) 54 61  Resp: 17 17  Temp: 36.4 C   SpO2: 96% 96%    Last Pain:  Vitals:   05/13/21 1248  TempSrc:   PainSc: 0-No pain                 Louann Sjogren

## 2021-05-13 NOTE — Transfer of Care (Signed)
Immediate Anesthesia Transfer of Care Note  Patient: DOROTHEA YOW  Procedure(s) Performed: FLEXIBLE SIGMOIDOSCOPY BIOPSY  Patient Location: PACU  Anesthesia Type:General  Level of Consciousness: awake, alert , oriented and patient cooperative  Airway & Oxygen Therapy: Patient Spontanous Breathing  Post-op Assessment: Report given to RN, Post -op Vital signs reviewed and stable and Patient moving all extremities X 4  Post vital signs: Reviewed and stable  Last Vitals:  Vitals Value Taken Time  BP 92/43 05/13/21 1243  Temp 36.4 C 05/13/21 1243  Pulse 54 05/13/21 1243  Resp 17 05/13/21 1243  SpO2 96 % 05/13/21 1243    Last Pain:  Vitals:   05/13/21 1243  TempSrc: Oral  PainSc:       Patients Stated Pain Goal: 10 (81/82/99 3716)  Complications: No notable events documented.

## 2021-05-13 NOTE — Interval H&P Note (Signed)
History and Physical Interval Note:  05/13/2021 12:20 PM Kathy Zuniga is a 65 y.o. female with PMH rectal cancer T3N2M0, who presents for evaluation of rectal bleeding and rectal cancer.  BP 133/75   Pulse (!) 59   Temp 97.8 F (36.6 C) (Oral)   Resp 13   Ht 5\' 4"  (1.626 m)   Wt 59.4 kg   SpO2 100%   BMI 22.49 kg/m  GENERAL: The patient is AO x3, in no acute distress. HEENT: Head is normocephalic and atraumatic. EOMI are intact. Mouth is well hydrated and without lesions. NECK: Supple. No masses LUNGS: Clear to auscultation. No presence of rhonchi/wheezing/rales. Adequate chest expansion HEART: RRR, normal s1 and s2. ABDOMEN: Soft, nontender, no guarding, no peritoneal signs, and nondistended. BS +. No masses. EXTREMITIES: Without any cyanosis, clubbing, rash, lesions or edema. NEUROLOGIC: AOx3, no focal motor deficit. SKIN: no jaundice, no rashes   Kathy Zuniga  has presented today for surgery, with the diagnosis of Rectal Cancer.  The various methods of treatment have been discussed with the patient and family. After consideration of risks, benefits and other options for treatment, the patient has consented to  Procedure(s) with comments: FLEXIBLE SIGMOIDOSCOPY (N/A) - 1:45 as a surgical intervention.  The patient's history has been reviewed, patient examined, no change in status, stable for surgery.  I have reviewed the patient's chart and labs.  Questions were answered to the patient's satisfaction.     Kathy Zuniga

## 2021-05-13 NOTE — Op Note (Signed)
[facility] Patient Name: Kathy Zuniga Procedure Date: 05/13/2021 12:19 PM MRN: [id number] Date of Birth: 09-20-1955 Attending MD: Maylon Peppers ,  CSN: [id number] Age: 65 Admit Type: Outpatient Procedure:                Flexible Sigmoidoscopy Indications:              Hematochezia, Personal history of malignant rectal                            neoplasm Providers:                Maylon Peppers, Rosina Lowenstein, RN, Casimer Bilis, Technician Referring MD:              Medicines:                Monitored Anesthesia Care Complications:            No immediate complications. Estimated Blood Loss:     Estimated blood loss: none. Procedure:                Pre-Anesthesia Assessment:                           - Prior to the procedure, a History and Physical                            was performed, and patient medications, allergies                            and sensitivities were reviewed. The patient's                            tolerance of previous anesthesia was reviewed.                           - The risks and benefits of the procedure and the                            sedation options and risks were discussed with the                            patient. All questions were answered and informed                            consent was obtained.                           - ASA Grade Assessment: III - A patient with severe                            systemic disease.                           After obtaining informed consent, the scope was  passed under direct vision. The [id number]-[id number]                            ([id number]) was introduced through the anus and                            advanced to the the sigmoid colon. The flexible                            sigmoidoscopy was accomplished without difficulty.                            The patient tolerated the procedure well. The                            quality  of the bowel preparation was good. Scope In: 12:32:05 PM Scope Out: 12:39:44 PM Total Procedure Duration: 0 hours 7 minutes 39 seconds  Findings:      The perianal and digital rectal examinations were normal.      A tattoo was seen in the rectum, proximal to the mass area. The tattoo       site appeared normal.      An infiltrative non-obstructing mass was found from 3 to 5 cm proximal       to the anus. The mass was partially circumferential (involving one-third       of the lumen circumference). The mass measured two cm in length. In       addition, its diameter measured twenty-five mm. No bleeding was present.       There was presence of a depression in the most proximal area of the       lesion. There were some areas of fungating tissue. The lesion was       biopsied with a cold forceps for histology.      Non-bleeding internal hemorrhoids were found during retroflexion. The       hemorrhoids were small. Impression:               - A tattoo was seen in the rectum. The tattoo site                            appeared normal.                           - Malignant tumor from 3 to 5 cm proximal to the                            anus. Biopsied.                           - Non-bleeding internal hemorrhoids. Moderate Sedation:      Per Anesthesia Care Recommendation:           - Discharge patient to home (ambulatory).                           - Resume previous diet.                           -  Await pathology results.                           - Can take Miralax on daily basis to soften bowel                            movements and avoid bleeding episodes. Procedure Code(s):        --- Professional ---                           [phone], Sigmoidoscopy, flexible; with biopsy, single                            or multiple Diagnosis Code(s):        --- Professional ---                           K64.8, Other hemorrhoids                           C18.8, Malignant neoplasm of overlapping sites  of                            colon                           K92.1, Melena (includes Hematochezia)                           Z85.048, Personal history of other malignant                            neoplasm of rectum, rectosigmoid junction, and anus CPT copyright 2019 [employer]. All rights reserved. The codes documented in this report are preliminary and upon coder review may  be revised to meet current compliance requirements. Maylon Peppers, MD Maylon Peppers,  05/13/2021 12:55:32 PM This report has been signed electronically. Number of Addenda: 0

## 2021-05-15 ENCOUNTER — Encounter (HOSPITAL_COMMUNITY): Payer: Self-pay | Admitting: Gastroenterology

## 2021-05-15 LAB — SURGICAL PATHOLOGY

## 2021-05-20 ENCOUNTER — Ambulatory Visit (HOSPITAL_COMMUNITY): Payer: Medicare Other

## 2021-05-29 ENCOUNTER — Ambulatory Visit (HOSPITAL_COMMUNITY): Payer: Medicare Other

## 2021-05-29 ENCOUNTER — Other Ambulatory Visit (HOSPITAL_COMMUNITY): Payer: Self-pay | Admitting: *Deleted

## 2021-05-29 DIAGNOSIS — C20 Malignant neoplasm of rectum: Secondary | ICD-10-CM

## 2021-06-01 ENCOUNTER — Ambulatory Visit (HOSPITAL_COMMUNITY): Payer: Medicare Other

## 2021-06-01 ENCOUNTER — Inpatient Hospital Stay (HOSPITAL_COMMUNITY): Payer: Medicare Other | Attending: Hematology

## 2021-06-01 DIAGNOSIS — C20 Malignant neoplasm of rectum: Secondary | ICD-10-CM | POA: Diagnosis not present

## 2021-06-01 LAB — COMPREHENSIVE METABOLIC PANEL
ALT: 16 U/L (ref 0–44)
AST: 19 U/L (ref 15–41)
Albumin: 3.9 g/dL (ref 3.5–5.0)
Alkaline Phosphatase: 60 U/L (ref 38–126)
Anion gap: 4 — ABNORMAL LOW (ref 5–15)
BUN: 11 mg/dL (ref 8–23)
CO2: 27 mmol/L (ref 22–32)
Calcium: 9.6 mg/dL (ref 8.9–10.3)
Chloride: 103 mmol/L (ref 98–111)
Creatinine, Ser: 0.48 mg/dL (ref 0.44–1.00)
GFR, Estimated: >60 mL/min (ref >60)
Glucose, Bld: 99 mg/dL (ref 70–99)
Potassium: 4 mmol/L (ref 3.5–5.1)
Sodium: 134 mmol/L — ABNORMAL LOW (ref 135–145)
Total Bilirubin: 0.6 mg/dL (ref 0.3–1.2)
Total Protein: 6.9 g/dL (ref 6.5–8.1)

## 2021-06-01 LAB — CBC WITH DIFFERENTIAL/PLATELET
Abs Immature Granulocytes: 0.02 10*3/uL (ref 0.00–0.07)
Basophils Absolute: 0 10*3/uL (ref 0.0–0.1)
Basophils Relative: 0 %
Eosinophils Absolute: 0.1 10*3/uL (ref 0.0–0.5)
Eosinophils Relative: 1 %
HCT: 43 % (ref 36.0–46.0)
Hemoglobin: 14.2 g/dL (ref 12.0–15.0)
Immature Granulocytes: 0 %
Lymphocytes Relative: 35 %
Lymphs Abs: 2.6 10*3/uL (ref 0.7–4.0)
MCH: 32.6 pg (ref 26.0–34.0)
MCHC: 33 g/dL (ref 30.0–36.0)
MCV: 98.6 fL (ref 80.0–100.0)
Monocytes Absolute: 0.6 10*3/uL (ref 0.1–1.0)
Monocytes Relative: 8 %
Neutro Abs: 4 10*3/uL (ref 1.7–7.7)
Neutrophils Relative %: 56 %
Platelets: 347 10*3/uL (ref 150–400)
RBC: 4.36 MIL/uL (ref 3.87–5.11)
RDW: 12.8 % (ref 11.5–15.5)
WBC: 7.3 10*3/uL (ref 4.0–10.5)
nRBC: 0 % (ref 0.0–0.2)

## 2021-06-02 LAB — CEA: CEA: 5.8 ng/mL — ABNORMAL HIGH (ref 0.0–4.7)

## 2021-06-03 ENCOUNTER — Other Ambulatory Visit: Payer: Self-pay

## 2021-06-03 ENCOUNTER — Ambulatory Visit (HOSPITAL_COMMUNITY)
Admission: RE | Admit: 2021-06-03 | Discharge: 2021-06-03 | Disposition: A | Discharge status: Home / Self Care | Payer: Medicare Other | Source: Ambulatory Visit | Attending: Hematology | Admitting: Hematology

## 2021-06-03 DIAGNOSIS — R590 Localized enlarged lymph nodes: Secondary | ICD-10-CM | POA: Diagnosis not present

## 2021-06-03 DIAGNOSIS — C20 Malignant neoplasm of rectum: Secondary | ICD-10-CM | POA: Diagnosis not present

## 2021-06-03 DIAGNOSIS — D375 Neoplasm of uncertain behavior of rectum: Secondary | ICD-10-CM | POA: Diagnosis not present

## 2021-06-03 DIAGNOSIS — K6289 Other specified diseases of anus and rectum: Secondary | ICD-10-CM | POA: Diagnosis not present

## 2021-06-04 ENCOUNTER — Encounter (HOSPITAL_COMMUNITY): Payer: Self-pay

## 2021-06-05 ENCOUNTER — Other Ambulatory Visit (HOSPITAL_COMMUNITY): Payer: Medicare Other

## 2021-06-05 ENCOUNTER — Ambulatory Visit (HOSPITAL_COMMUNITY): Payer: Medicare Other

## 2021-06-05 ENCOUNTER — Encounter (HOSPITAL_COMMUNITY): Payer: Self-pay

## 2021-06-08 ENCOUNTER — Ambulatory Visit (HOSPITAL_COMMUNITY): Payer: Medicare Other | Admitting: Hematology

## 2021-06-08 DIAGNOSIS — R109 Unspecified abdominal pain: Secondary | ICD-10-CM | POA: Diagnosis not present

## 2021-06-08 DIAGNOSIS — M810 Age-related osteoporosis without current pathological fracture: Secondary | ICD-10-CM | POA: Diagnosis not present

## 2021-06-08 DIAGNOSIS — R030 Elevated blood-pressure reading, without diagnosis of hypertension: Secondary | ICD-10-CM | POA: Diagnosis not present

## 2021-06-08 DIAGNOSIS — C20 Malignant neoplasm of rectum: Secondary | ICD-10-CM | POA: Diagnosis not present

## 2021-06-09 ENCOUNTER — Ambulatory Visit (HOSPITAL_COMMUNITY): Payer: Medicare Other | Admitting: Hematology

## 2021-06-09 DIAGNOSIS — K6289 Other specified diseases of anus and rectum: Secondary | ICD-10-CM | POA: Diagnosis not present

## 2021-06-11 DIAGNOSIS — K6289 Other specified diseases of anus and rectum: Secondary | ICD-10-CM | POA: Diagnosis not present

## 2021-06-11 DIAGNOSIS — Z6822 Body mass index (BMI) 22.0-22.9, adult: Secondary | ICD-10-CM | POA: Diagnosis not present

## 2021-06-28 HISTORY — PX: OTHER SURGICAL HISTORY: SHX169

## 2021-07-01 DIAGNOSIS — R339 Retention of urine, unspecified: Secondary | ICD-10-CM | POA: Diagnosis not present

## 2021-07-01 DIAGNOSIS — M810 Age-related osteoporosis without current pathological fracture: Secondary | ICD-10-CM | POA: Diagnosis not present

## 2021-07-01 DIAGNOSIS — K6289 Other specified diseases of anus and rectum: Secondary | ICD-10-CM | POA: Diagnosis not present

## 2021-07-01 DIAGNOSIS — E039 Hypothyroidism, unspecified: Secondary | ICD-10-CM | POA: Diagnosis not present

## 2021-07-01 DIAGNOSIS — C20 Malignant neoplasm of rectum: Secondary | ICD-10-CM | POA: Diagnosis not present

## 2021-07-10 ENCOUNTER — Ambulatory Visit (HOSPITAL_COMMUNITY): Payer: Medicare Other

## 2021-07-16 DIAGNOSIS — Z09 Encounter for follow-up examination after completed treatment for conditions other than malignant neoplasm: Secondary | ICD-10-CM | POA: Diagnosis not present

## 2021-07-16 DIAGNOSIS — C20 Malignant neoplasm of rectum: Secondary | ICD-10-CM | POA: Diagnosis not present

## 2021-07-24 DIAGNOSIS — C20 Malignant neoplasm of rectum: Secondary | ICD-10-CM | POA: Diagnosis not present

## 2021-07-24 DIAGNOSIS — R790 Abnormal level of blood mineral: Secondary | ICD-10-CM | POA: Diagnosis not present

## 2021-07-24 DIAGNOSIS — D550 Anemia due to glucose-6-phosphate dehydrogenase [G6PD] deficiency: Secondary | ICD-10-CM | POA: Diagnosis not present

## 2021-07-24 DIAGNOSIS — I781 Nevus, non-neoplastic: Secondary | ICD-10-CM | POA: Diagnosis not present

## 2021-07-24 DIAGNOSIS — C189 Malignant neoplasm of colon, unspecified: Secondary | ICD-10-CM | POA: Diagnosis not present

## 2021-08-03 DIAGNOSIS — H5203 Hypermetropia, bilateral: Secondary | ICD-10-CM | POA: Diagnosis not present

## 2021-08-03 DIAGNOSIS — H524 Presbyopia: Secondary | ICD-10-CM | POA: Diagnosis not present

## 2021-08-20 NOTE — Progress Notes (Signed)
Patient had declined to reschedule follow up appointments to address.

## 2021-08-22 ENCOUNTER — Other Ambulatory Visit: Payer: Self-pay

## 2021-08-22 DIAGNOSIS — I781 Nevus, non-neoplastic: Secondary | ICD-10-CM

## 2021-08-25 ENCOUNTER — Ambulatory Visit: Payer: Medicare Other | Admitting: Vascular Surgery

## 2021-08-25 ENCOUNTER — Other Ambulatory Visit: Payer: Self-pay

## 2021-08-25 ENCOUNTER — Ambulatory Visit (HOSPITAL_COMMUNITY)
Admission: RE | Admit: 2021-08-25 | Discharge: 2021-08-25 | Disposition: A | Discharge status: Home / Self Care | Payer: Medicare Other | Source: Ambulatory Visit | Attending: Vascular Surgery | Admitting: Vascular Surgery

## 2021-08-25 ENCOUNTER — Encounter: Payer: Self-pay | Admitting: Vascular Surgery

## 2021-08-25 DIAGNOSIS — I781 Nevus, non-neoplastic: Secondary | ICD-10-CM

## 2021-08-25 DIAGNOSIS — I872 Venous insufficiency (chronic) (peripheral): Secondary | ICD-10-CM

## 2021-08-25 DIAGNOSIS — M7989 Other specified soft tissue disorders: Secondary | ICD-10-CM

## 2021-08-25 NOTE — Progress Notes (Signed)
Patient name: Kathy Zuniga MRN: [id number] DOB: [birth year] Sex: female  REASON FOR CONSULT: Evaluate bilateral lower extremity varicose veins  HPI: Kathy Zuniga is a 66 y.o. female, with history of rectal cancer that presents for evaluation of bilateral lower extremity varicose veins.  She states that she has noticed prominent veins in her lower extremities for years.  She has started to have more discomfort at the right ankle and has much more prominent varicosities in the right leg.  She has had no history of DVT.  No trauma.  She thinks she got some sclerotherapy in the left leg years ago at a family practice office.  She is wearing knee-high compression stockings.  Past Medical History:  Diagnosis Date   Arthritis    Back pain    Fracture of coccyx ([place])    Frequent headaches    Migraines    No blood products 02/15/2017   Jehovah's Witness   No blood products    Jehovah Witness   Osteoporosis    Thyroid disease     Past Surgical History:  Procedure Laterality Date   BIOPSY  05/13/2021   Procedure: BIOPSY;  Surgeon: Harvel Quale, MD;  [facility];  Service: Gastroenterology;;   Otho Darner SIGMOIDOSCOPY N/A 05/13/2021   Procedure: Beryle Quant;  Surgeon: Harvel Quale, MD;  [facility];  Service: Gastroenterology;  Laterality: N/A;  1:45   WISDOM TOOTH EXTRACTION      Family History  Problem Relation Age of Onset   Osteoporosis Mother        Mom passed away from pathological fractures   Arthritis Mother    CVA Mother        TIA's   Colon cancer Father 34   Heart failure Father    Arthritis Father    Drug abuse Brother    Heart disease Paternal Grandfather    Hypothyroidism Daughter    Diabetes Mellitus II Daughter    Diabetes Mellitus II Son     SOCIAL HISTORY: Social History   Socioeconomic History   Marital status: Married    Spouse name: [name]   Number of children: 3   Years of  education: Not on file   Highest education level: Not on file  Occupational History   Occupation: unemployed  Tobacco Use   Smoking status: Never   Smokeless tobacco: Never  Vaping Use   Vaping Use: Never used  Substance and Sexual Activity   Alcohol use: Yes    Alcohol/week: 6.0 standard drinks    Types: 6 Standard drinks or equivalent per week    Comment: occas. wine   Drug use: No   Sexual activity: Not Currently    Birth control/protection: Post-menopausal  Other Topics Concern   Not on file  Social History Narrative   Work or School:Retired, used to do [place] with husband      Spiritual Beliefs:Jehovah's Witness      Lifestyle:walking 60 minutes daily and eating a healthy diet, does eat a lot of dairy   Social Determinants of Health   Financial Resource Strain: Low Risk    Difficulty of Paying Living Expenses: Not hard at all  Food Insecurity: No Food Insecurity   Worried About Charity fundraiser in the Last Year: Never true   [place] in the Last Year: Never true  Transportation Needs: No Transportation Needs   Lack of Transportation (Medical): No  Lack of Transportation (Non-Medical): No  Physical Activity: Insufficiently Active   Days of Exercise per Week: 4 days   Minutes of Exercise per Session: 30 min  Stress: No Stress Concern Present   Feeling of Stress : Not at all  Social Connections: Socially Integrated   Frequency of Communication with Friends and Family: More than three times a week   Frequency of Social Gatherings with Friends and Family: More than three times a week   Attends Religious Services: More than 4 times per year   Active Member of [employer] or Organizations: Yes   Attends Archivist Meetings: Never   Marital Status: Married  Human resources officer Violence: Not At Risk   Fear of Current or Ex-Partner: No   Emotionally Abused: No   Physically Abused: No   Sexually Abused: No    Allergies   Allergen Reactions   Almond Meal Other (See Comments)    Throat itching/irritation   Asa [Aspirin] Other (See Comments)    GI bleed     Ibuprofen Other (See Comments)    GI bleed    Current Outpatient Medications  Medication Sig Dispense Refill   Ascorbic Acid (VITAMIN C) 1000 MG tablet Take 1,000 mg by mouth See admin instructions. Take 1 tablet (1000 mg) by mouth up to 2 times daily     BLACK CURRANT SEED OIL PO Take 1,250 mg by mouth daily in the afternoon.     Cholecalciferol (VITAMIN D-3) 125 MCG (5000 UT) TABS Take 5,000 Units by mouth in the morning.     Coenzyme Q10 (COQ10) 100 MG CAPS Take 100 mg by mouth in the morning.     MAGNESIUM MALATE PO Take 950 mg by mouth at bedtime.     Menaquinone-7 (VITAMIN K2 PO) Take 150 mcg by mouth in the morning.     RED CLOVER LEAF EXTRACT PO Take 900 mg by mouth See admin instructions. Take 1 tablet (900 mg) by mouth up to 3 times daily     TURMERIC PO Take 1,000 mg by mouth in the morning.     No current facility-administered medications for this visit.    REVIEW OF SYSTEMS:  [X]  denotes positive finding, [ ]  denotes negative finding Cardiac  Comments:  Chest pain or chest pressure:    Shortness of breath upon exertion:    Short of breath when lying flat:    Irregular heart rhythm:        Vascular    Pain in calf, thigh, or hip brought on by ambulation:    Pain in feet at night that wakes you up from your sleep:     Blood clot in your veins:    Leg swelling:         Pulmonary    Oxygen at home:    Productive cough:     Wheezing:         Neurologic    Sudden weakness in arms or legs:     Sudden numbness in arms or legs:     Sudden onset of difficulty speaking or slurred speech:    Temporary loss of vision in one eye:     Problems with dizziness:         Gastrointestinal    Blood in stool:     Vomited blood:         Genitourinary    Burning when urinating:     Blood in urine:        Psychiatric  Major  depression:         Hematologic    Bleeding problems:    Problems with blood clotting too easily:        Skin    Rashes or ulcers:        Constitutional    Fever or chills:      PHYSICAL EXAM: Vitals:   08/25/21 1332  BP: 117/61  Pulse: 75  Resp: 14  Temp: 98.2 F (36.8 C)  TempSrc: Temporal  SpO2: 96%  Weight: 137 lb (62.1 kg)  Height: 5\' 4"  (1.626 m)    GENERAL: The patient is a well-nourished female, in no acute distress. The vital signs are documented above. CARDIAC: There is a regular rate and rhythm.  VASCULAR:  Palpable femoral pulses bilaterally Palpable PT pulses bilaterally Prominent varicosities down the right medial leg including the thigh and calf Bilateral prominent spider veins and reticular veins PULMONARY: No respiratory distress. ABDOMEN: Soft and non-tender. NEUROLOGIC: No focal weakness or paresthesias are detected. SKIN: There are no ulcers or rashes noted. PSYCHIATRIC: The patient has a normal affect.  DATA:   Lower Venous Reflux Study   Patient Name:  Kathy Zuniga  Date of Exam:   08/25/2021  Medical Rec #: [id number]              Accession #:    [id number]  Date of Birth: [birth year]               Patient Gender: F  Patient Age:   53 years  Exam Location:  [facility]  Procedure:      VAS [place] LOWER EXTREMITY VENOUS REFLUX  Referring Phys: Monica Martinez    ---------------------------------------------------------------------------  -----     Indications: Pain, and varicosities.      Comparison Study: 10/30/2014: Rt- No evidence of deep vein thrombosis.   Performing Technologist: Ivan Croft      Examination Guidelines: A complete evaluation includes B-mode imaging,  spectral  Doppler, color Doppler, and power Doppler as needed of all accessible  portions  of each vessel. Bilateral testing is considered an integral part of a  complete  examination. Limited examinations for reoccurring indications may  be  performed  as noted. The reflux portion of the exam is performed with the patient in  reverse Trendelenburg.  Significant venous reflux is defined as >500 ms in the superficial venous  system, and >1 second in the deep venous system.      Venous Reflux Times  +--------------+---------+------+-----------+------------+-----------------  ----+   RIGHT          Reflux No Reflux Reflux Time Diameter cms Comments                                            Yes                                                     +--------------+---------+------+-----------+------------+-----------------  ----+   CFV            no                                                                 +--------------+---------+------+-----------+------------+-----------------  ----+  FV mid         no                                                                 +--------------+---------+------+-----------+------------+-----------------  ----+   Popliteal      no                                                                 +--------------+---------+------+-----------+------------+-----------------  ----+   GSV at [facility]                yes     >500 ms       0.55                              +--------------+---------+------+-----------+------------+-----------------  ----+   GSV prox thigh            yes     >500 ms       0.50                              +--------------+---------+------+-----------+------------+-----------------  ----+   GSV mid thigh             yes     >500 ms       0.53                              +--------------+---------+------+-----------+------------+-----------------  ----+   GSV dist thigh            yes     >500 ms       0.50                              +--------------+---------+------+-----------+------------+-----------------  ----+   GSV at knee               yes     >500 ms       0.51                               +--------------+---------+------+-----------+------------+-----------------  ----+   GSV prox calf             yes     >500 ms       0.49     large branch at                                                                     proximal calf  level     +--------------+---------+------+-----------+------------+-----------------  ----+   SSV Pop Fossa  no  0.48                              +--------------+---------+------+-----------+------------+-----------------  ----+   SSV prox calf  no                               0.20                              +--------------+---------+------+-----------+------------+-----------------  ----+   SSV mid calf              yes     >500 ms       0.46                              +--------------+---------+------+-----------+------------+-----------------  ----+     Summary:  Right:  - No evidence of deep vein thrombosis seen in the right lower extremity,  from the common femoral through the popliteal veins.  - No evidence of superficial venous thrombosis in the right lower  extremity.     - Venous reflux is noted in the right sapheno-femoral junction.  - Venous reflux is noted in the right greater saphenous vein in the thigh.  - Venous reflux is noted in the right greater saphenous vein in the calf.  - Venous reflux is noted in the right short saphenous vein (mid calf).     *See table(s) above for measurements and observations.   Assessment/Plan:  66 year old female presents with venous insufficiency of the bilateral lower extremities worse in the right leg with prominent varicosities consistent with CEAP classification C2.  I discussed with her the etiology of venous disease and venous reflux.  She has significant superficial venous reflux in the right leg throughout the right great saphenous vein from the saphenofemoral junction down to the calf.  The vein is also quite large measuring over 5 mm.  I have  recommended initial conservative measures with elevation, exercise, and compression.  We got her sized for thigh-high compression stockings 20 to 30 mmHg with medical grade compression stockings.  I will have her return in 3 months to be evaluated by one of my partners to see if she is a candidate for more invasive therapy like laser ablation.   Marty Heck, MD [facility] Office: [phone]

## 2021-09-28 DIAGNOSIS — R799 Abnormal finding of blood chemistry, unspecified: Secondary | ICD-10-CM | POA: Diagnosis not present

## 2021-09-28 DIAGNOSIS — R5383 Other fatigue: Secondary | ICD-10-CM | POA: Diagnosis not present

## 2021-09-28 DIAGNOSIS — C189 Malignant neoplasm of colon, unspecified: Secondary | ICD-10-CM | POA: Diagnosis not present

## 2021-10-12 DIAGNOSIS — M9903 Segmental and somatic dysfunction of lumbar region: Secondary | ICD-10-CM | POA: Diagnosis not present

## 2021-10-12 DIAGNOSIS — M62830 Muscle spasm of back: Secondary | ICD-10-CM | POA: Diagnosis not present

## 2021-10-12 DIAGNOSIS — M546 Pain in thoracic spine: Secondary | ICD-10-CM | POA: Diagnosis not present

## 2021-10-12 DIAGNOSIS — M9902 Segmental and somatic dysfunction of thoracic region: Secondary | ICD-10-CM | POA: Diagnosis not present

## 2021-10-16 DIAGNOSIS — M9903 Segmental and somatic dysfunction of lumbar region: Secondary | ICD-10-CM | POA: Diagnosis not present

## 2021-10-16 DIAGNOSIS — R0609 Other forms of dyspnea: Secondary | ICD-10-CM | POA: Diagnosis not present

## 2021-10-16 DIAGNOSIS — M546 Pain in thoracic spine: Secondary | ICD-10-CM | POA: Diagnosis not present

## 2021-10-16 DIAGNOSIS — R319 Hematuria, unspecified: Secondary | ICD-10-CM | POA: Diagnosis not present

## 2021-10-16 DIAGNOSIS — R109 Unspecified abdominal pain: Secondary | ICD-10-CM | POA: Diagnosis not present

## 2021-10-16 DIAGNOSIS — M62830 Muscle spasm of back: Secondary | ICD-10-CM | POA: Diagnosis not present

## 2021-10-16 DIAGNOSIS — M9902 Segmental and somatic dysfunction of thoracic region: Secondary | ICD-10-CM | POA: Diagnosis not present

## 2021-10-19 DIAGNOSIS — M546 Pain in thoracic spine: Secondary | ICD-10-CM | POA: Diagnosis not present

## 2021-10-19 DIAGNOSIS — M62830 Muscle spasm of back: Secondary | ICD-10-CM | POA: Diagnosis not present

## 2021-10-19 DIAGNOSIS — M9902 Segmental and somatic dysfunction of thoracic region: Secondary | ICD-10-CM | POA: Diagnosis not present

## 2021-10-19 DIAGNOSIS — M9903 Segmental and somatic dysfunction of lumbar region: Secondary | ICD-10-CM | POA: Diagnosis not present

## 2021-10-21 DIAGNOSIS — M9903 Segmental and somatic dysfunction of lumbar region: Secondary | ICD-10-CM | POA: Diagnosis not present

## 2021-10-21 DIAGNOSIS — M62830 Muscle spasm of back: Secondary | ICD-10-CM | POA: Diagnosis not present

## 2021-10-21 DIAGNOSIS — M9902 Segmental and somatic dysfunction of thoracic region: Secondary | ICD-10-CM | POA: Diagnosis not present

## 2021-10-21 DIAGNOSIS — M546 Pain in thoracic spine: Secondary | ICD-10-CM | POA: Diagnosis not present

## 2021-10-28 DIAGNOSIS — M9903 Segmental and somatic dysfunction of lumbar region: Secondary | ICD-10-CM | POA: Diagnosis not present

## 2021-10-28 DIAGNOSIS — M9902 Segmental and somatic dysfunction of thoracic region: Secondary | ICD-10-CM | POA: Diagnosis not present

## 2021-10-28 DIAGNOSIS — M546 Pain in thoracic spine: Secondary | ICD-10-CM | POA: Diagnosis not present

## 2021-10-28 DIAGNOSIS — M62830 Muscle spasm of back: Secondary | ICD-10-CM | POA: Diagnosis not present

## 2021-11-04 DIAGNOSIS — M9903 Segmental and somatic dysfunction of lumbar region: Secondary | ICD-10-CM | POA: Diagnosis not present

## 2021-11-04 DIAGNOSIS — M62830 Muscle spasm of back: Secondary | ICD-10-CM | POA: Diagnosis not present

## 2021-11-04 DIAGNOSIS — M9902 Segmental and somatic dysfunction of thoracic region: Secondary | ICD-10-CM | POA: Diagnosis not present

## 2021-11-04 DIAGNOSIS — M546 Pain in thoracic spine: Secondary | ICD-10-CM | POA: Diagnosis not present

## 2021-11-11 DIAGNOSIS — M9902 Segmental and somatic dysfunction of thoracic region: Secondary | ICD-10-CM | POA: Diagnosis not present

## 2021-11-11 DIAGNOSIS — M9903 Segmental and somatic dysfunction of lumbar region: Secondary | ICD-10-CM | POA: Diagnosis not present

## 2021-11-11 DIAGNOSIS — M62830 Muscle spasm of back: Secondary | ICD-10-CM | POA: Diagnosis not present

## 2021-11-11 DIAGNOSIS — M546 Pain in thoracic spine: Secondary | ICD-10-CM | POA: Diagnosis not present

## 2021-11-24 DIAGNOSIS — C20 Malignant neoplasm of rectum: Secondary | ICD-10-CM | POA: Diagnosis not present

## 2021-11-24 DIAGNOSIS — R590 Localized enlarged lymph nodes: Secondary | ICD-10-CM | POA: Diagnosis not present

## 2021-11-25 ENCOUNTER — Ambulatory Visit: Payer: Medicare Other | Admitting: Vascular Surgery

## 2021-12-02 DIAGNOSIS — R030 Elevated blood-pressure reading, without diagnosis of hypertension: Secondary | ICD-10-CM | POA: Diagnosis not present

## 2021-12-07 DIAGNOSIS — I872 Venous insufficiency (chronic) (peripheral): Secondary | ICD-10-CM | POA: Diagnosis not present

## 2021-12-07 DIAGNOSIS — M810 Age-related osteoporosis without current pathological fracture: Secondary | ICD-10-CM | POA: Diagnosis not present

## 2021-12-07 DIAGNOSIS — E782 Mixed hyperlipidemia: Secondary | ICD-10-CM | POA: Diagnosis not present

## 2021-12-07 DIAGNOSIS — C20 Malignant neoplasm of rectum: Secondary | ICD-10-CM | POA: Diagnosis not present

## 2021-12-09 ENCOUNTER — Encounter: Payer: Self-pay | Admitting: Vascular Surgery

## 2021-12-09 ENCOUNTER — Ambulatory Visit: Payer: Medicare Other | Admitting: Vascular Surgery

## 2021-12-09 VITALS — BP 136/84 | HR 55 | Temp 98.0°F | Resp 20 | Ht 64.0 in | Wt 148.0 lb

## 2021-12-09 DIAGNOSIS — I872 Venous insufficiency (chronic) (peripheral): Secondary | ICD-10-CM | POA: Diagnosis not present

## 2021-12-09 DIAGNOSIS — M9902 Segmental and somatic dysfunction of thoracic region: Secondary | ICD-10-CM | POA: Diagnosis not present

## 2021-12-09 DIAGNOSIS — M62830 Muscle spasm of back: Secondary | ICD-10-CM | POA: Diagnosis not present

## 2021-12-09 DIAGNOSIS — M546 Pain in thoracic spine: Secondary | ICD-10-CM | POA: Diagnosis not present

## 2021-12-09 DIAGNOSIS — M9903 Segmental and somatic dysfunction of lumbar region: Secondary | ICD-10-CM | POA: Diagnosis not present

## 2021-12-09 NOTE — Progress Notes (Signed)
Patient ID: Kathy Zuniga, female   DOB: [birth year], 66 y.o.   MRN: [id number]  Reason for Consult: No chief complaint on file.   Referred by Donnamae Jude, FNP  Subjective:     HPI:  Kathy Zuniga is a 66 y.o. female here for 54-monthfollow-up of bilateral right greater than left varicose veins.  3 months ago she was prescribed thigh-high compression stockings which she has been wearing.  No history of DVT does have a history of sclerotherapy in her left leg several years ago.  Past Medical History:  Diagnosis Date   Arthritis    Back pain    Fracture of coccyx ([facility])    Frequent headaches    Migraines    No blood products 02/15/2017   Jehovah's Witness   No blood products    Jehovah Witness   Osteoporosis    Thyroid disease    Family History  Problem Relation Age of Onset   Osteoporosis Mother        Mom passed away from pathological fractures   Arthritis Mother    CVA Mother        TIA's   Colon cancer Father 74  Heart failure Father    Arthritis Father    Drug abuse Brother    Heart disease Paternal Grandfather    Hypothyroidism Daughter    Diabetes Mellitus II Daughter    Diabetes Mellitus II Son    Past Surgical History:  Procedure Laterality Date   BIOPSY  05/13/2021   Procedure: BIOPSY;  Surgeon: CHarvel Quale MD;  [facility];  Service: Gastroenterology;;   FOtho DarnerSIGMOIDOSCOPY N/A 05/13/2021   Procedure: FBeryle Quant  Surgeon: CHarvel Quale MD;  [facility];  Service: Gastroenterology;  Laterality: N/A;  1:45   WISDOM TOOTH EXTRACTION      Short Social History:  Social History   Tobacco Use   Smoking status: Never   Smokeless tobacco: Never  Substance Use Topics   Alcohol use: Yes    Alcohol/week: 6.0 standard drinks of alcohol    Types: 6 Standard drinks or equivalent per week    Comment: occas. wine    Allergies  Allergen Reactions   Almond Meal Other (See  Comments)    Throat itching/irritation   Asa [Aspirin] Other (See Comments)    GI bleed     Ibuprofen Other (See Comments)    GI bleed    Current Outpatient Medications  Medication Sig Dispense Refill   Ascorbic Acid (VITAMIN C) 1000 MG tablet Take 1,000 mg by mouth See admin instructions. Take 1 tablet (1000 mg) by mouth up to 2 times daily     BLACK CURRANT SEED OIL PO Take 1,250 mg by mouth daily in the afternoon.     Cholecalciferol (VITAMIN D-3) 125 MCG (5000 UT) TABS Take 5,000 Units by mouth in the morning.     Coenzyme Q10 (COQ10) 100 MG CAPS Take 100 mg by mouth in the morning.     MAGNESIUM MALATE PO Take 950 mg by mouth at bedtime.     Menaquinone-7 (VITAMIN K2 PO) Take 150 mcg by mouth in the morning.     RED CLOVER LEAF EXTRACT PO Take 900 mg by mouth See admin instructions. Take 1 tablet (900 mg) by mouth up to 3 times daily     TURMERIC PO Take 1,000 mg by mouth in the morning.     No current facility-administered medications for this  visit.    Review of Systems  Constitutional:  Constitutional negative. HENT: HENT negative.  Eyes: Eyes negative.  Respiratory: Respiratory negative.  Cardiovascular: Cardiovascular negative.  GI: Gastrointestinal negative.  Musculoskeletal: Musculoskeletal negative.  Skin: Skin negative.  Neurological: Neurological negative. Hematologic: Hematologic/lymphatic negative.  Psychiatric: Psychiatric negative.        Objective:  Objective  Vitals:   12/09/21 1049  BP: 136/84  Pulse: (!) 55  Resp: 20  Temp: 98 F (36.7 C)  SpO2: 97%    Physical Exam HENT:     Head: Normocephalic.     Nose: Nose normal.  Eyes:     Pupils: Pupils are equal, round, and reactive to light.  Cardiovascular:     Rate and Rhythm: Normal rate.  Pulmonary:     Effort: Pulmonary effort is normal.  Abdominal:     General: Abdomen is flat.     Palpations: Abdomen is soft.  Musculoskeletal:        General: Normal range of motion.     Cervical  back: Normal range of motion and neck supple.     Right lower leg: No edema.     Left lower leg: No edema.     Comments: Right medial varicosities, multiple areas of telangiectasias bilateral lower extremities  Skin:    General: Skin is warm and dry.     Capillary Refill: Capillary refill takes less than 2 seconds.  Neurological:     General: No focal deficit present.     Mental Status: She is alert.  Psychiatric:        Mood and Affect: Mood normal.        Behavior: Behavior normal.        Thought Content: Thought content normal.     Data: Venous Reflux Times  +--------------+---------+------+-----------+------------+-----------------  ----+  RIGHT         Reflux NoRefluxReflux TimeDiameter cmsComments                                        Yes                                                 +--------------+---------+------+-----------+------------+-----------------  ----+  CFV           no                                                            +--------------+---------+------+-----------+------------+-----------------  ----+  FV mid        no                                                            +--------------+---------+------+-----------+------------+-----------------  ----+  Popliteal     no                                                            +--------------+---------+------+-----------+------------+-----------------  ----+  GSV at [employer]              yes    >500 ms      0.55                            +--------------+---------+------+-----------+------------+-----------------  ----+  GSV prox thigh          yes    >500 ms      0.50                            +--------------+---------+------+-----------+------------+-----------------  ----+  GSV mid thigh           yes    >500 ms      0.53                            +--------------+---------+------+-----------+------------+-----------------   ----+  GSV dist thigh          yes    >500 ms      0.50                            +--------------+---------+------+-----------+------------+-----------------  ----+  GSV at knee             yes    >500 ms      0.51                            +--------------+---------+------+-----------+------------+-----------------  ----+  GSV prox calf           yes    >500 ms      0.49    large branch at                                                              proximal calf  level    +--------------+---------+------+-----------+------------+-----------------  ----+  SSV Pop Fossa no                            0.48                            +--------------+---------+------+-----------+------------+-----------------  ----+  SSV prox calf no                            0.20                            +--------------+---------+------+-----------+------------+-----------------  ----+  SSV mid calf            yes    >500 ms      0.46                            +--------------+---------+------+-----------+------------+-----------------  ----+           Summary:  Right:  - No evidence of deep vein thrombosis seen in the right lower extremity,  from the common  femoral through the popliteal veins.  - No evidence of superficial venous thrombosis in the right lower  extremity.     - Venous reflux is noted in the right sapheno-femoral junction.  - Venous reflux is noted in the right greater saphenous vein in the thigh.  - Venous reflux is noted in the right greater saphenous vein in the calf.  - Venous reflux is noted in the right short saphenous vein (mid calf).      Assessment/Plan:    66 year old female with CEAP classification C2 venous disease wit\h right lower extremity varicosities particularly on the medial calf with reflux throughout her greater saphenous vein.  I reviewed her previous venous reflux study and also evaluated her  saphenous vein at bedside today with findings of large greater saphenous vein in the fascia below the knee but and exits the fascia just above the knee but is deep enough for laser ablation.  She also has multiple varicosities around the medial right leg for stab phlebectomy totaling 10-20.  After that she will require mostly sclerotherapy of the right lower extremity we will also need to evaluate the left lower extremity for reflux but most of her disease on the left does appear amenable to scleroderma as well.  I discussed the expected procedure with the patient and she demonstrates good understanding and I will have [place] reach out to her in the near future.     Waynetta Sandy MD [facility]

## 2021-12-10 DIAGNOSIS — Z6825 Body mass index (BMI) 25.0-25.9, adult: Secondary | ICD-10-CM | POA: Diagnosis not present

## 2021-12-10 DIAGNOSIS — C20 Malignant neoplasm of rectum: Secondary | ICD-10-CM | POA: Diagnosis not present

## 2022-01-04 ENCOUNTER — Other Ambulatory Visit: Payer: Self-pay | Admitting: *Deleted

## 2022-01-04 DIAGNOSIS — I83811 Varicose veins of right lower extremities with pain: Secondary | ICD-10-CM

## 2022-01-06 DIAGNOSIS — M546 Pain in thoracic spine: Secondary | ICD-10-CM | POA: Diagnosis not present

## 2022-01-06 DIAGNOSIS — M9902 Segmental and somatic dysfunction of thoracic region: Secondary | ICD-10-CM | POA: Diagnosis not present

## 2022-01-06 DIAGNOSIS — M62830 Muscle spasm of back: Secondary | ICD-10-CM | POA: Diagnosis not present

## 2022-01-06 DIAGNOSIS — M9903 Segmental and somatic dysfunction of lumbar region: Secondary | ICD-10-CM | POA: Diagnosis not present

## 2022-01-25 ENCOUNTER — Ambulatory Visit: Payer: Medicare Other | Admitting: Internal Medicine

## 2022-03-23 ENCOUNTER — Other Ambulatory Visit: Payer: Self-pay | Admitting: *Deleted

## 2022-03-23 MED ORDER — LORAZEPAM 1 MG PO TABS: ORAL_TABLET | ORAL | 0 refills | Status: DC

## 2022-04-01 ENCOUNTER — Ambulatory Visit (INDEPENDENT_AMBULATORY_CARE_PROVIDER_SITE_OTHER): Payer: Medicare Other | Admitting: Vascular Surgery

## 2022-04-01 ENCOUNTER — Encounter: Payer: Self-pay | Admitting: Vascular Surgery

## 2022-04-01 VITALS — BP 116/67 | HR 70 | Temp 98.3°F | Resp 16 | Ht 64.0 in | Wt 154.0 lb

## 2022-04-01 DIAGNOSIS — I872 Venous insufficiency (chronic) (peripheral): Secondary | ICD-10-CM | POA: Diagnosis not present

## 2022-04-01 HISTORY — PX: ENDOVENOUS ABLATION SAPHENOUS VEIN W/ LASER: SUR449

## 2022-04-01 NOTE — Progress Notes (Signed)
     Laser Ablation Procedure     Date: 04/01/2022   Kathy Zuniga DOB:06-Nov-1955  Consent signed: Yes      Surgeon: Servando Snare MD   Procedure: Laser Ablation: right Greater Saphenous Vein  BP 116/67 (BP Location: Left Arm, Patient Position: Sitting, Cuff Size: Normal)   Pulse 70   Temp 98.3 F (36.8 C) (Temporal)   Resp 16   Ht '5\' 4"'$  (1.626 m)   Wt 154 lb (69.9 kg)   SpO2 97%   BMI 26.43 kg/m   Tumescent Anesthesia: 475 cc 0.9% NaCl with 50 cc Lidocaine HCL 1%  and 15 cc 8.4% NaHCO3  Local Anesthesia: 20 cc Lidocaine HCL and NaHCO3 (ratio 2:1)  7 watts continuous mode     Total energy: 1490 Joules    Total time: 212 seconds  Treatment Length 31 cm   Laser Fiber Ref. #  [id number]   Lot #  [id number]   Stab Phlebectomy: 10-20 Sites: Calf  Patient tolerated procedure well  Notes:  All staff members wore facial masks.  Kathy Zuniga took Ativan 1 mg (1 tablet) on 04-01-2022 at 9:50 AM.   Description of Procedure:  After marking the course of the secondary varicosities, the patient was placed on the operating table in the supine position, and the right leg was prepped and draped in sterile fashion.   Local anesthetic was administered and under ultrasound guidance the saphenous vein was accessed with a micro needle and guide wire; then the mirco puncture sheath was placed.  A guide wire was inserted saphenofemoral junction , followed by a 5 french sheath.  The position of the sheath and then the laser fiber below the junction was confirmed using the ultrasound.  Tumescent anesthesia was administered along the course of the saphenous vein using ultrasound guidance. The patient was placed in Trendelenburg position and protective laser glasses were placed on patient and staff, and the laser was fired at 7 watts continuous mode for a total of 1490 joules.   For stab phlebectomies, local anesthetic was administered at the previously marked varicosities, and tumescent  anesthesia was administered around the vessels.  Ten to 20 stab wounds were made using the tip of an 11 blade. And using the vein hook, the phlebectomies were performed using a hemostat to avulse the varicosities.  Adequate hemostasis was achieved.     Steri strips were applied to the stab wounds and ABD pads and thigh high compression stockings were applied.  Ace wrap bandages were applied over the phlebectomy sites and at the top of the saphenofemoral junction. Blood loss was less than 15 cc.  Discharge instructions reviewed with patient and hardcopy of discharge instructions given to patient to take home. The patient ambulated out of the operating room having tolerated the procedure well.

## 2022-04-01 NOTE — Progress Notes (Signed)
Patient name: Kathy Zuniga MRN: [id number] DOB: Sep 04, 1955 Sex: female  REASON FOR VISIT: Right greater saphenous vein ablation and stab phlebectomy  HPI: Kathy Zuniga is a 66 y.o. female with C2 venous disease in the right lower extremity with multiple varicosities.  We have previously evaluated her great saphenous vein at the bedside and have discussed greater saphenous vein ablation and stab phlebectomy between 10 and 20 and the likely need for sclerotherapy after that.  She has been compliant with her compression stockings and presents for procedure today.  Current Outpatient Medications  Medication Sig Dispense Refill   Ascorbic Acid (VITAMIN C) 1000 MG tablet Take 1,000 mg by mouth See admin instructions. Take 1 tablet (1000 mg) by mouth up to 2 times daily     BLACK CURRANT SEED OIL PO Take 1,250 mg by mouth daily in the afternoon.     Cholecalciferol (VITAMIN D-3) 125 MCG (5000 UT) TABS Take 5,000 Units by mouth in the morning.     Coenzyme Q10 (COQ10) 100 MG CAPS Take 100 mg by mouth in the morning.     LORazepam (ATIVAN) 1 MG tablet Take 1 tablet 30 minutes prior to leaving house on day of office surgery.  Bring second tablet with you to office on day of office surgery. 2 tablet 0   MAGNESIUM MALATE PO Take 950 mg by mouth at bedtime.     Menaquinone-7 (VITAMIN K2 PO) Take 150 mcg by mouth in the morning.     RED CLOVER LEAF EXTRACT PO Take 900 mg by mouth See admin instructions. Take 1 tablet (900 mg) by mouth up to 3 times daily     TURMERIC PO Take 1,000 mg by mouth in the morning.     No current facility-administered medications for this visit.    PHYSICAL EXAM: Vitals:   04/01/22 1050  BP: 116/67  Pulse: 70  Resp: 16  Temp: 98.3 F (36.8 C)  SpO2: 97%    Awake alert and oriented Nonlabored respirations Right lower extremity varicosities marked with the patient standing and the greater saphenous vein mapped with ultrasound with the patient in  the supine position  PROCEDURE: Endovenous laser ablation right greater saphenous vein totaling 31 cm and stab phlebectomy totaling between 10 and 20   TECHNIQUE: The patient's veins were marked with her in the standing position.  The right greater saphenous vein was marked with the patient supine.  She was then prepped and draped in the right lower extremity in the usual fashion and an a timeout was called and we all confirmed right lower extremity as the correct side.  The right greater saphenous vein was then identified with ultrasound and the area was anesthetized with 1% lidocaine and this was cannulated just below the knee with a micropuncture needle followed by wire and a micropuncture sheath.  A J-wire was then placed 2.5 cm from the saphenofemoral junction which was easily visualized.  A 45 cm laser sheath was then brought and placed just to the level of the J-wire also just 2.5 cm from the saphenofemoral junction and there was a large sidebranch approximately 0.5 cm from the saphenofemoral junction that we were below.  We then anesthetized with tumescent anesthesia along the level of the expected laser path and then performed laser ablation for a total of 31 cm.  At completion the common femoral vein was patent and easily compressed and the greater saphenous vein did appear sclerotic throughout its course.  Attention was then turned to the multiple areas of stab phlebectomy.  All areas were first anesthetized with 1% lidocaine and then instilled with tumescent anesthesia.  Between 10 and 20 stab phlebectomy incisions were performed in the medial leg.  At completion a sterile dressing was placed followed by compressive wrap.  She tolerated the procedure well without immediate complications.  Plan will be for follow-up in 2 weeks with right lower extremity post ablation duplex to rule out DVT.   Servando Snare [facility]

## 2022-04-12 NOTE — Progress Notes (Signed)
CARDIOLOGY CONSULT NOTE       Patient ID: Kathy Zuniga MRN: [id number] DOB/AGE: May 09, 1956 66 y.o.  Admit date: (Not on file) Referring Physician: Delphina Cahill  Primary Physician: Donnamae Jude, FNP Primary Cardiologist: New Reason for Consultation: Dyspnea  Active Problems:   * No active hospital problems. *   HPI:  66 y.o. referred by Dr Nevada Crane for dyspnea.  History of migraines , varicose veins seeing Dr Donzetta Matters VVS with prior 04/01/22 had right GSV stab phlebectomy and ablation She sees oncology for history of rectal cancer Cancer spread into muscularis propria and lymph nodes but patient refused chemo and is also a Jehova's Witness. She received Vt C infusion and ora supplements in [place] Last sigmoidoscopy 05/03/21 with infiltrating lesion 3-5 cm from anus. Hct 43 06/01/21 Non smoker.   Seen by Dr Nevada Crane 09/2021 note only indicated blood in urine for 2 days and right flank pain   She had surgery at [facility] and apparently is doing well from cancer stand point Will have Scans and colonoscopy in December  Active walking and volunteering Married 50 years. Has daughter here and sons in [place] and [place]  Has had vague pre syncope and dyspnea for 9 years ? Occular migraines in past with right left sided weakness Cardiac w/u 9 years ago negative Went to [facility] earlier this year and had exertional dyspnea Has gained 30 lbs since cancer diagnosis No chest pain ? Palpitations with feeling of lump in throat  ROS All other systems reviewed and negative except as noted above  Past Medical History:  Diagnosis Date   Arthritis    Back pain    Fracture of coccyx (HCC)    Frequent headaches    Migraines    No blood products 02/15/2017   Jehovah's Witness   No blood products    Jehovah Witness   Osteoporosis    Thyroid disease     Family History  Problem Relation Age of Onset   Osteoporosis Mother        Mom passed away from pathological fractures   Arthritis Mother    CVA  Mother        TIA's   Colon cancer Father 33   Heart failure Father    Arthritis Father    Drug abuse Brother    Heart disease Paternal Grandfather    Hypothyroidism Daughter    Diabetes Mellitus II Daughter    Diabetes Mellitus II Son     Social History   Socioeconomic History   Marital status: Married    Spouse name: [name]   Number of children: 3   Years of education: Not on file   Highest education level: Not on file  Occupational History   Occupation: unemployed  Tobacco Use   Smoking status: Never   Smokeless tobacco: Never  Vaping Use   Vaping Use: Never used  Substance and Sexual Activity   Alcohol use: Yes    Alcohol/week: 6.0 standard drinks of alcohol    Types: 6 Standard drinks or equivalent per week    Comment: occas. wine   Drug use: No   Sexual activity: Not Currently    Birth control/protection: Post-menopausal  Other Topics Concern   Not on file  Social History Narrative   Work or School:Retired, used to do [place] with husband      Spiritual Beliefs:Jehovah's Witness      Lifestyle:walking 60 minutes daily and eating a healthy diet,  does eat a lot of dairy   Social Determinants of Health   Financial Resource Strain: Low Risk  (10/01/2020)   Overall Financial Resource Strain (CARDIA)    Difficulty of Paying Living Expenses: Not hard at all  Food Insecurity: No Food Insecurity (10/01/2020)   Hunger Vital Sign    Worried About Running Out of Food in the Last Year: Never true    Ran Out of Food in the Last Year: Never true  Transportation Needs: No Transportation Needs (10/01/2020)   PRAPARE - Hydrologist (Medical): No    Lack of Transportation (Non-Medical): No  Physical Activity: Insufficiently Active (10/01/2020)   Exercise Vital Sign    Days of Exercise per Week: 4 days    Minutes of Exercise per Session: 30 min  Stress: No Stress Concern Present (10/01/2020)   [place]    Feeling of Stress : Not at all  Social Connections: [place] (10/01/2020)   Social Connection and Isolation Panel [NHANES]    Frequency of Communication with Friends and Family: More than three times a week    Frequency of Social Gatherings with Friends and Family: More than three times a week    Attends Religious Services: More than 4 times per year    Active Member of [employer] or Organizations: Yes    Attends Archivist Meetings: Never    Marital Status: Married  Human resources officer Violence: Not At Risk (10/01/2020)   Humiliation, Afraid, Rape, and Kick questionnaire    Fear of Current or Ex-Partner: No    Emotionally Abused: No    Physically Abused: No    Sexually Abused: No    Past Surgical History:  Procedure Laterality Date   BIOPSY  05/13/2021   Procedure: BIOPSY;  Surgeon: Montez Morita, Quillian Quince, MD;  [facility];  Service: Gastroenterology;;   ENDOVENOUS ABLATION SAPHENOUS VEIN W/ LASER Right 04/01/2022   endovenous laser ablation right greater saphenous vein and stab phlebectomy 10-20 incisions right leg by Servando Snare MD   [place] N/A 05/13/2021   Procedure: Beryle Quant;  Surgeon: Montez Morita, Quillian Quince, MD;  Location: [facility];  Service: Gastroenterology;  Laterality: N/A;  1:45   WISDOM TOOTH EXTRACTION        Current Outpatient Medications:    Ascorbic Acid (VITAMIN C) 1000 MG tablet, Take 1,000 mg by mouth See admin instructions. Take 1 tablet (1000 mg) by mouth up to 2 times daily, Disp: , Rfl:    BLACK CURRANT SEED OIL PO, Take 1,250 mg by mouth daily in the afternoon., Disp: , Rfl:    Cholecalciferol (VITAMIN D-3) 125 MCG (5000 UT) TABS, Take 5,000 Units by mouth in the morning., Disp: , Rfl:    MAGNESIUM MALATE PO, Take 950 mg by mouth at bedtime., Disp: , Rfl:    Menaquinone-7 (VITAMIN K2 PO), Take 150 mcg by mouth in the morning., Disp: , Rfl:     TURMERIC PO, Take 1,000 mg by mouth in the morning., Disp: , Rfl:    Coenzyme Q10 (COQ10) 100 MG CAPS, Take 100 mg by mouth in the morning., Disp: , Rfl:    LORazepam (ATIVAN) 1 MG tablet, Take 1 tablet 30 minutes prior to leaving house on day of office surgery.  Bring second tablet with you to office on day of office surgery. (Patient not taking: Reported on 04/16/2022), Disp: 2 tablet, Rfl: 0   RED CLOVER LEAF  EXTRACT PO, Take 900 mg by mouth See admin instructions. Take 1 tablet (900 mg) by mouth up to 3 times daily, Disp: , Rfl:     Physical Exam: Blood pressure 132/78, pulse 71, height '5\' 4"'$  (1.626 m), weight 154 lb (69.9 kg), SpO2 98 %.     Affect appropriate Healthy:  appears stated age 66: normal Neck supple with no adenopathy JVP normal no bruits no thyromegaly Lungs clear with no wheezing and good diaphragmatic motion Heart:  S1/S2 no murmur, no rub, gallop or click PMI normal Abdomen: benighn, BS positve, no tenderness, no AAA no bruit.  No HSM or HJR Distal pulses intact with no bruits No edema Neuro non-focal Skin warm and dry No muscular weakness  Labs:   Lab Results  Component Value Date   WBC 7.3 06/01/2021   HGB 14.2 06/01/2021   HCT 43.0 06/01/2021   MCV 98.6 06/01/2021   PLT 347 06/01/2021      Radiology: VAS [place] LOWER EXTREMITY VENOUS POST ABLATION  Result Date: 04/15/2022  Lower Venous Reflux Study Patient Name:  PRIYANKA CAUSEY  Date of Exam:   04/15/2022 Medical Rec #: [id number]              Accession #:    [id number] Date of Birth: Mar 04, 1956               Patient Gender: F Patient Age:   28 years Exam [facility] Procedure:      VAS [place] LOWER EXTREMITY VENOUS POST ABLATION Referring Phys: Servando Snare --------------------------------------------------------------------------------  Indications: Right 2 week follow up GSV post-ablation evaluation and left leg reflux exam.  Performing Technologist: Ronal Fear  RVS, RCS  Examination Guidelines: A complete evaluation includes B-mode imaging, spectral Doppler, color Doppler, and power Doppler as needed of all accessible portions of each vessel. Bilateral testing is considered an integral part of a complete examination. Limited examinations for reoccurring indications may be performed as noted. The reflux portion of the exam is performed with the patient in reverse Trendelenburg. Significant venous reflux is defined as >500 ms in the superficial venous system, and >1 second in the deep venous system.  Venous Reflux Times +---------+---------+------+-----------+------------+--------+ RIGHT    Reflux NoRefluxReflux TimeDiameter cmsComments                    Yes                                  +---------+---------+------+-----------+------------+--------+ CFV                                            patent   +---------+---------+------+-----------+------------+--------+ FV mid                                         patent   +---------+---------+------+-----------+------------+--------+ Popliteal                                      patent   +---------+---------+------+-----------+------------+--------+  +--------------+---------+------+-----------+------------+---------------------+ LEFT          Reflux NoRefluxReflux TimeDiameter cmsComments  Yes                                               +--------------+---------+------+-----------+------------+---------------------+ CFV                     yes   >1 second                                   +--------------+---------+------+-----------+------------+---------------------+ FV mid        no                                                          +--------------+---------+------+-----------+------------+---------------------+ Popliteal     no                                                           +--------------+---------+------+-----------+------------+---------------------+ GSV at [facility]    no                            0.60                          +--------------+---------+------+-----------+------------+---------------------+ GSV prox thighno                            0.35                          +--------------+---------+------+-----------+------------+---------------------+ GSV mid thigh no                            0.35                          +--------------+---------+------+-----------+------------+---------------------+ GSV dist thighno                            0.35                          +--------------+---------+------+-----------+------------+---------------------+ GSV at knee   no                            0.26                          +--------------+---------+------+-----------+------------+---------------------+ GSV prox calf no                            0.19                          +--------------+---------+------+-----------+------------+---------------------+ SSV Pop Fossa  yes    >500 ms      0.39    connection to                                                             popliteal and                                                             slightly tortuous                                                         near the SPJ          +--------------+---------+------+-----------+------------+---------------------+ SSV prox calf           yes    >500 ms      0.45                          +--------------+---------+------+-----------+------------+---------------------+ SSV mid calf            yes    >500 ms      0.34                          +--------------+---------+------+-----------+------------+---------------------+   Summary: Right: - No evidence of deep vein thrombosis from the common femoral through the popliteal veins. - Successful ablation of the great saphenous vein from the  proximal calf up to approximately 1.21 cm from the saphenofemoral junction.  Left: - No evidence of deep vein thrombosis from the common femoral through the popliteal veins. - No evidence of superficial venous thrombosis. - The common femoral vein is not competent. - The great saphenous vein is competent. - The small saphenous vein is not competent.  *See table(s) above for measurements and observations. Electronically signed by Servando Snare MD on 04/15/2022 at 10:48:02 AM.    Final     EKG: SR rate 71 normal    ASSESSMENT AND PLAN:   Dyspnea:  Normal exam and ECG will order TTE and labs  Rectal Cancer:  f/u [facility] post surgical resection scans and colonoscopy in December  Venous Varicosities:  F/U Dr Donzetta Matters VVS post ablative Rx RLE Compression stockings  Jehova's Witness:  no blood products  CAD Risk:  calcium score Perfusion study if > 400   TTE BMET/Hct/TSH/BNP Calcium Score  F/U in a year  Signed: Jenkins Rouge 04/16/2022, 9:39 AM

## 2022-04-15 ENCOUNTER — Ambulatory Visit (HOSPITAL_COMMUNITY)
Admission: RE | Admit: 2022-04-15 | Discharge: 2022-04-15 | Disposition: A | Discharge status: Home / Self Care | Payer: Medicare Other | Source: Ambulatory Visit | Attending: Vascular Surgery | Admitting: Vascular Surgery

## 2022-04-15 ENCOUNTER — Ambulatory Visit (INDEPENDENT_AMBULATORY_CARE_PROVIDER_SITE_OTHER): Payer: Medicare Other | Admitting: Vascular Surgery

## 2022-04-15 ENCOUNTER — Encounter: Payer: Self-pay | Admitting: Vascular Surgery

## 2022-04-15 VITALS — BP 118/74 | HR 71 | Temp 98.3°F | Resp 14 | Ht 64.0 in | Wt 150.0 lb

## 2022-04-15 DIAGNOSIS — I872 Venous insufficiency (chronic) (peripheral): Secondary | ICD-10-CM

## 2022-04-15 DIAGNOSIS — I83811 Varicose veins of right lower extremities with pain: Secondary | ICD-10-CM | POA: Diagnosis not present

## 2022-04-15 NOTE — Progress Notes (Signed)
Subjective:     Patient ID: Kathy Zuniga, female   DOB: 1956/06/11, 66 y.o.   MRN: [id number]  HPI 66 year old female status post right greater saphenous vein ablation and stab phlebectomy between 10 and 20.  She has had some significant discomfort along the medial thigh and has been wearing compression socks but not necessarily taken ibuprofen or doing much icing or using vena care.  Pain is improving daily.   Review of Systems Pain right thigh    Objective:   Physical Exam Vitals:   04/15/22 1030  BP: 118/74  Pulse: 71  Resp: 14  Temp: 98.3 F (36.8 C)  SpO2: 98%   Awake alert oriented Nonlabored respirations Right thigh with bruising along the medial aspect of the expected saphenous vein tract Bruising right medial leg at sites of previous appendectomy Spider veins evident bilateral lower extremities  Venous Reflux Times  +---------+---------+------+-----------+------------+--------+  RIGHT    Reflux NoRefluxReflux TimeDiameter cmsComments                     Yes                                   +---------+---------+------+-----------+------------+--------+  CFV                                            patent    +---------+---------+------+-----------+------------+--------+  FV mid                                         patent    +---------+---------+------+-----------+------------+--------+  Popliteal                                      patent    +---------+---------+------+-----------+------------+--------+      +--------------+---------+------+-----------+------------+-----------------  ----+  LEFT          Reflux NoRefluxReflux TimeDiameter cmsComments                                        Yes                                                 +--------------+---------+------+-----------+------------+-----------------  ----+  CFV                     yes   >1 second                                      +--------------+---------+------+-----------+------------+-----------------  ----+  FV mid        no                                                            +--------------+---------+------+-----------+------------+-----------------  ----+  Popliteal     no                                                            +--------------+---------+------+-----------+------------+-----------------  ----+  GSV at [facility]    no                            0.60                            +--------------+---------+------+-----------+------------+-----------------  ----+  GSV prox thighno                            0.35                            +--------------+---------+------+-----------+------------+-----------------  ----+  GSV mid thigh no                            0.35                            +--------------+---------+------+-----------+------------+-----------------  ----+  GSV dist thighno                            0.35                            +--------------+---------+------+-----------+------------+-----------------  ----+  GSV at knee   no                            0.26                            +--------------+---------+------+-----------+------------+-----------------  ----+  GSV prox calf no                            0.19                            +--------------+---------+------+-----------+------------+-----------------  ----+  SSV Pop Fossa           yes    >500 ms      0.39    connection to                                                                popliteal and  slightly  tortuous                                                          near the SPJ            +--------------+---------+------+-----------+------------+-----------------  ----+  SSV prox calf           yes    >500 ms      0.45                             +--------------+---------+------+-----------+------------+-----------------  ----+  SSV mid calf            yes    >500 ms      0.34                            +--------------+---------+------+-----------+------------+-----------------  ----+          Summary:  Right:  - No evidence of deep vein thrombosis from the common femoral through the  popliteal veins.  - Successful ablation of the great saphenous vein from the proximal calf  up to approximately 1.21 cm from the saphenofemoral junction.      Left:  - No evidence of deep vein thrombosis from the common femoral through the  popliteal veins.  - No evidence of superficial venous thrombosis.  - The common femoral vein is not competent.  - The great saphenous vein is competent.  - The small saphenous vein is not competent.        Assessment:     36 female status post right greater saphenous vein ablation and stab phlebectomy no evidence of DVT on today's study.  Left lower extremity without significant reflux for treatment.    Plan:     Follow-up with Estell Harpin, RN on November 6 for sclerotherapy right lower extremity and she can also discuss future treatment of the left lower extremity at that time.  I encouraged her to continue ice as needed as well as ibuprofen for anti-inflammatory treatment and compression stockings at least until after sclerotherapy as directed by [name].  She can see me on an as-needed basis  Ashok Sawaya C. Donzetta Matters, MD [facility] Office: [phone] Pager: [phone]

## 2022-04-16 ENCOUNTER — Encounter: Payer: Self-pay | Admitting: Cardiovascular Disease

## 2022-04-16 ENCOUNTER — Ambulatory Visit: Payer: Medicare Other | Attending: Cardiovascular Disease | Admitting: Cardiovascular Disease

## 2022-04-16 ENCOUNTER — Telehealth: Payer: Self-pay | Admitting: Cardiovascular Disease

## 2022-04-16 ENCOUNTER — Other Ambulatory Visit (HOSPITAL_COMMUNITY)
Admission: RE | Admit: 2022-04-16 | Discharge: 2022-04-16 | Disposition: A | Discharge status: Home / Self Care | Payer: Medicare Other | Source: Ambulatory Visit | Attending: Cardiovascular Disease | Admitting: Cardiovascular Disease

## 2022-04-16 VITALS — BP 132/78 | HR 71 | Ht 64.0 in | Wt 154.0 lb

## 2022-04-16 DIAGNOSIS — R0602 Shortness of breath: Secondary | ICD-10-CM | POA: Diagnosis not present

## 2022-04-16 DIAGNOSIS — R0609 Other forms of dyspnea: Secondary | ICD-10-CM

## 2022-04-16 DIAGNOSIS — I8311 Varicose veins of right lower extremity with inflammation: Secondary | ICD-10-CM | POA: Diagnosis not present

## 2022-04-16 LAB — HEMATOCRIT: HCT: 44.8 % (ref 36.0–46.0)

## 2022-04-16 LAB — BASIC METABOLIC PANEL
Anion gap: 8 (ref 5–15)
BUN: 15 mg/dL (ref 8–23)
CO2: 25 mmol/L (ref 22–32)
Calcium: 10 mg/dL (ref 8.9–10.3)
Chloride: 106 mmol/L (ref 98–111)
Creatinine, Ser: 0.51 mg/dL (ref 0.44–1.00)
GFR, Estimated: >60 mL/min (ref >60)
Glucose, Bld: 99 mg/dL (ref 70–99)
Potassium: 4.2 mmol/L (ref 3.5–5.1)
Sodium: 139 mmol/L (ref 135–145)

## 2022-04-16 LAB — TSH: TSH: 0.765 u[IU]/mL (ref 0.350–4.500)

## 2022-04-16 LAB — BRAIN NATRIURETIC PEPTIDE: B Natriuretic Peptide: 33 pg/mL (ref 0.0–100.0)

## 2022-04-16 NOTE — Patient Instructions (Addendum)
Medication Instructions:  Your physician recommends that you continue on your current medications as directed. Please refer to the Current Medication list given to you today.  *If you need a refill on your cardiac medications before your next appointment, please call your pharmacy*   Lab Work: Your physician recommends that you return for lab work in: Today ( BMET, BNP, TSH, Hct)   If you have labs (blood work) drawn today and your tests are completely normal, you will receive your results only by: [place] (if you have MyChart) OR A paper copy in the mail If you have any lab test that is abnormal or we need to change your treatment, we will call you to review the results.   Testing/Procedures: Your physician has requested that you have an echocardiogram. Echocardiography is a painless test that uses sound waves to create images of your heart. It provides your doctor with information about the size and shape of your heart and how well your heart's chambers and valves are working. This procedure takes approximately one hour. There are no restrictions for this procedure. Please do NOT wear cologne, perfume, aftershave, or lotions (deodorant is allowed). Please arrive 15 minutes prior to your appointment time.  Calcium Score CT   Follow-Up: At [facility], you and your health needs are our priority.  As part of our continuing mission to provide you with exceptional heart care, we have created designated Provider Care Teams.  These Care Teams include your primary Cardiologist (physician) and Advanced Practice Providers (APPs -  Physician Assistants and Nurse Practitioners) who all work together to provide you with the care you need, when you need it.  We recommend signing up for the patient portal called "MyChart".  Sign up information is provided on this After Visit Summary.  MyChart is used to connect with patients for Virtual Visits (Telemedicine).  Patients are able to view  lab/test results, encounter notes, upcoming appointments, etc.  Non-urgent messages can be sent to your provider as well.   To learn more about what you can do with MyChart, go to [email or web address].    Your next appointment:    As Needed pending test results   The format for your next appointment:   In Person  Provider:   Jenkins Rouge, MD    Other Instructions Thank you for choosing [facility]!    Important Information About [place]

## 2022-04-16 NOTE — Telephone Encounter (Signed)
Patient notified and verbalized understanding. Patient had no questions or concerns at this time. PCP copied 

## 2022-04-16 NOTE — Telephone Encounter (Signed)
Patient returning call for lab results. 

## 2022-04-16 NOTE — Telephone Encounter (Signed)
Kathy Hector, MD  04/16/2022 12:04 PM EDT     Labs all good including thyroid and no CHF

## 2022-04-21 ENCOUNTER — Ambulatory Visit (HOSPITAL_COMMUNITY)
Admission: RE | Admit: 2022-04-21 | Discharge: 2022-04-21 | Disposition: A | Discharge status: Home / Self Care | Payer: Medicare Other | Source: Ambulatory Visit | Attending: Cardiovascular Disease | Admitting: Cardiovascular Disease

## 2022-04-21 DIAGNOSIS — R0609 Other forms of dyspnea: Secondary | ICD-10-CM | POA: Diagnosis not present

## 2022-04-21 DIAGNOSIS — R070 Pain in throat: Secondary | ICD-10-CM | POA: Diagnosis not present

## 2022-04-21 LAB — ECHOCARDIOGRAM COMPLETE
Area-P 1/2: 1.79 cm2
S' Lateral: 2.5 cm

## 2022-04-21 NOTE — Progress Notes (Signed)
*  PRELIMINARY RESULTS* Echocardiogram 2D Echocardiogram has been performed.  Kathy Zuniga 04/21/2022, 10:22 AM

## 2022-05-03 ENCOUNTER — Ambulatory Visit: Payer: Medicare Other

## 2022-05-03 DIAGNOSIS — I8393 Asymptomatic varicose veins of bilateral lower extremities: Secondary | ICD-10-CM

## 2022-05-03 NOTE — Progress Notes (Signed)
Treated pt's BLE spider and small reticular veins with Asclera 1% administered with a 27g butterfly.  Patient received a total of 4 mL. Pt tolerated well. She has had chronic tenderness in the areas treated. She was given post treatment care instructions on handout and verbally. She is driving to [place] next week and has been advised to continue to wear hose and take frequent walking breaks. We discussed coming back for another treatment at a later time. She will call with any questions/concerns.     Photos: Yes.    Compression stockings applied: Yes.

## 2022-05-07 DIAGNOSIS — M62830 Muscle spasm of back: Secondary | ICD-10-CM | POA: Diagnosis not present

## 2022-05-07 DIAGNOSIS — M9902 Segmental and somatic dysfunction of thoracic region: Secondary | ICD-10-CM | POA: Diagnosis not present

## 2022-05-07 DIAGNOSIS — M546 Pain in thoracic spine: Secondary | ICD-10-CM | POA: Diagnosis not present

## 2022-05-07 DIAGNOSIS — M9903 Segmental and somatic dysfunction of lumbar region: Secondary | ICD-10-CM | POA: Diagnosis not present

## 2022-05-10 DIAGNOSIS — E663 Overweight: Secondary | ICD-10-CM | POA: Diagnosis not present

## 2022-05-10 DIAGNOSIS — M62830 Muscle spasm of back: Secondary | ICD-10-CM | POA: Diagnosis not present

## 2022-05-10 DIAGNOSIS — Z6827 Body mass index (BMI) 27.0-27.9, adult: Secondary | ICD-10-CM | POA: Diagnosis not present

## 2022-05-12 DIAGNOSIS — M546 Pain in thoracic spine: Secondary | ICD-10-CM | POA: Diagnosis not present

## 2022-05-14 DIAGNOSIS — M62830 Muscle spasm of back: Secondary | ICD-10-CM | POA: Diagnosis not present

## 2022-05-14 DIAGNOSIS — M9902 Segmental and somatic dysfunction of thoracic region: Secondary | ICD-10-CM | POA: Diagnosis not present

## 2022-05-14 DIAGNOSIS — M546 Pain in thoracic spine: Secondary | ICD-10-CM | POA: Diagnosis not present

## 2022-05-14 DIAGNOSIS — M9903 Segmental and somatic dysfunction of lumbar region: Secondary | ICD-10-CM | POA: Diagnosis not present

## 2022-05-19 ENCOUNTER — Other Ambulatory Visit (HOSPITAL_COMMUNITY): Payer: Medicare Other

## 2022-05-24 DIAGNOSIS — M9902 Segmental and somatic dysfunction of thoracic region: Secondary | ICD-10-CM | POA: Diagnosis not present

## 2022-05-24 DIAGNOSIS — M546 Pain in thoracic spine: Secondary | ICD-10-CM | POA: Diagnosis not present

## 2022-05-24 DIAGNOSIS — M62830 Muscle spasm of back: Secondary | ICD-10-CM | POA: Diagnosis not present

## 2022-05-24 DIAGNOSIS — M9903 Segmental and somatic dysfunction of lumbar region: Secondary | ICD-10-CM | POA: Diagnosis not present

## 2022-06-07 DIAGNOSIS — E042 Nontoxic multinodular goiter: Secondary | ICD-10-CM | POA: Diagnosis not present

## 2022-06-07 DIAGNOSIS — C20 Malignant neoplasm of rectum: Secondary | ICD-10-CM | POA: Diagnosis not present

## 2022-06-07 DIAGNOSIS — I700 Atherosclerosis of aorta: Secondary | ICD-10-CM | POA: Diagnosis not present

## 2022-06-07 DIAGNOSIS — K5730 Diverticulosis of large intestine without perforation or abscess without bleeding: Secondary | ICD-10-CM | POA: Diagnosis not present

## 2022-06-07 DIAGNOSIS — J9811 Atelectasis: Secondary | ICD-10-CM | POA: Diagnosis not present

## 2022-06-07 DIAGNOSIS — R918 Other nonspecific abnormal finding of lung field: Secondary | ICD-10-CM | POA: Diagnosis not present

## 2022-06-07 DIAGNOSIS — M9903 Segmental and somatic dysfunction of lumbar region: Secondary | ICD-10-CM | POA: Diagnosis not present

## 2022-06-07 DIAGNOSIS — M546 Pain in thoracic spine: Secondary | ICD-10-CM | POA: Diagnosis not present

## 2022-06-07 DIAGNOSIS — M9902 Segmental and somatic dysfunction of thoracic region: Secondary | ICD-10-CM | POA: Diagnosis not present

## 2022-06-07 DIAGNOSIS — M62830 Muscle spasm of back: Secondary | ICD-10-CM | POA: Diagnosis not present

## 2022-06-07 DIAGNOSIS — R911 Solitary pulmonary nodule: Secondary | ICD-10-CM | POA: Diagnosis not present

## 2022-06-07 DIAGNOSIS — C218 Malignant neoplasm of overlapping sites of rectum, anus and anal canal: Secondary | ICD-10-CM | POA: Diagnosis not present

## 2022-06-17 DIAGNOSIS — C20 Malignant neoplasm of rectum: Secondary | ICD-10-CM | POA: Diagnosis not present

## 2022-07-01 DIAGNOSIS — E782 Mixed hyperlipidemia: Secondary | ICD-10-CM | POA: Diagnosis not present

## 2022-07-01 DIAGNOSIS — R030 Elevated blood-pressure reading, without diagnosis of hypertension: Secondary | ICD-10-CM | POA: Diagnosis not present

## 2022-07-05 DIAGNOSIS — M62830 Muscle spasm of back: Secondary | ICD-10-CM | POA: Diagnosis not present

## 2022-07-05 DIAGNOSIS — M546 Pain in thoracic spine: Secondary | ICD-10-CM | POA: Diagnosis not present

## 2022-07-05 DIAGNOSIS — M9903 Segmental and somatic dysfunction of lumbar region: Secondary | ICD-10-CM | POA: Diagnosis not present

## 2022-07-05 DIAGNOSIS — M9902 Segmental and somatic dysfunction of thoracic region: Secondary | ICD-10-CM | POA: Diagnosis not present

## 2022-07-08 DIAGNOSIS — C20 Malignant neoplasm of rectum: Secondary | ICD-10-CM | POA: Diagnosis not present

## 2022-07-08 DIAGNOSIS — E782 Mixed hyperlipidemia: Secondary | ICD-10-CM | POA: Diagnosis not present

## 2022-07-08 DIAGNOSIS — I872 Venous insufficiency (chronic) (peripheral): Secondary | ICD-10-CM | POA: Diagnosis not present

## 2022-07-08 DIAGNOSIS — M546 Pain in thoracic spine: Secondary | ICD-10-CM | POA: Diagnosis not present

## 2022-07-08 DIAGNOSIS — Z0001 Encounter for general adult medical examination with abnormal findings: Secondary | ICD-10-CM | POA: Diagnosis not present

## 2022-07-08 DIAGNOSIS — M810 Age-related osteoporosis without current pathological fracture: Secondary | ICD-10-CM | POA: Diagnosis not present

## 2022-07-08 DIAGNOSIS — M25512 Pain in left shoulder: Secondary | ICD-10-CM | POA: Diagnosis not present

## 2022-07-19 ENCOUNTER — Ambulatory Visit (HOSPITAL_COMMUNITY): Payer: Medicare Other | Attending: Family Medicine

## 2022-07-19 ENCOUNTER — Other Ambulatory Visit: Payer: Self-pay

## 2022-07-19 DIAGNOSIS — M546 Pain in thoracic spine: Secondary | ICD-10-CM | POA: Insufficient documentation

## 2022-07-19 NOTE — Therapy (Signed)
OUTPATIENT PHYSICAL THERAPY LOWER EXTREMITY EVALUATION   Patient Name: Kathy Zuniga MRN: [id number] DOB:November 29, 1955, 67 y.o., female Today's Date: 07/19/2022  END OF SESSION:  PT End of Session - 07/19/22 1132     Visit Number 1    Number of Visits 4    Date for PT Re-Evaluation 08/16/22    Authorization Type BCBS Medicare; no auth required    Progress Note Due on Visit 10    PT Start Time 1120    PT Stop Time 1200    PT Time Calculation (min) 40 min    Activity Tolerance Patient tolerated treatment well    Behavior During Therapy [facility] for tasks assessed/performed             Past Medical History:  Diagnosis Date   Arthritis    Back pain    Fracture of coccyx ([place])    Frequent headaches    Migraines    No blood products 02/15/2017   Jehovah's Witness   No blood products    Jehovah Witness   Osteoporosis    Thyroid disease    Past Surgical History:  Procedure Laterality Date   BIOPSY  05/13/2021   Procedure: BIOPSY;  Surgeon: Harvel Quale, MD;  [facility];  Service: Gastroenterology;;   ENDOVENOUS ABLATION SAPHENOUS VEIN W/ LASER Right 04/01/2022   endovenous laser ablation right greater saphenous vein and stab phlebectomy 10-20 incisions right leg by Servando Snare MD   [place] N/A 05/13/2021   Procedure: Beryle Quant;  Surgeon: Harvel Quale, MD;  Location: [facility];  Service: Gastroenterology;  Laterality: N/A;  1:45   WISDOM TOOTH EXTRACTION     Patient Active Problem List   Diagnosis Date Noted   Chronic venous insufficiency 08/25/2021   Rectal cancer ([place]) 04/27/2021   Refusal of blood transfusions as patient is Jehovah's Witness 07/05/2018   No blood products 02/15/2017   Radial styloid tenosynovitis 12/06/2016   Hemorrhoids 12/06/2016   Chronic midline thoracic back pain 12/06/2016   Chronic back pain 06/07/2016   Irritable bowel syndrome with diarrhea 06/07/2016   GERD without  esophagitis 07/11/2015   Osteoporosis 07/11/2015    PCP: Valentino Nose, FNP  REFERRING PROVIDER: Ezequiel Essex, FNP  REFERRING DIAG: thoracic pain  THERAPY DIAG:  Thoracic spine pain - Plan: PT plan of care cert/re-cert  Rationale for Evaluation and Treatment: Rehabilitation  ONSET DATE: Condition started for years now without any reason but got aggravated months ago when she twisted her back to the L. Patient states that she also had PT in the past for the same issue which helped. Patient then went to her MD and was given muscle relaxers which are not helping. Patient currently is going to her chiropractor and reports that the treatment is helping her back.  SUBJECTIVE:   SUBJECTIVE STATEMENT: Currently denies any pain at the time of eval. Patient reports that the pain on the upper/midback gets aggravated (6/10 on NPRS) when her arms are outstretched and she has to hold on to something. Pain is described as sharp, aching and burning and radiates to the right rib cage. Resting partially relieves (2/10 on NPRS). Patient also reports that the area in the midback is numb.  PERTINENT HISTORY: Osteoporosis, hx of breast CA PAIN:  Are you having pain? Yes: NPRS scale: 6/10 Pain location: mid/upper back Pain description: sharp, aching, burning and radiates to the R rib cage Aggravating factors: arms are outstretched and has to hold on to  something Relieving factors: rest  PRECAUTIONS: osteoporosis  WEIGHT BEARING RESTRICTIONS: No  FALLS: None Has patient fallen in last 6 months? No  LIVING ENVIRONMENT: Lives with: lives with their family and lives with their spouse Lives in: House/apartment Stairs: Yes: External: 3 steps; can reach both Has following equipment at home: None  OCCUPATION: retired  PLOF: Independent  PATIENT GOALS: Patient wants to get rid of the pain   OBJECTIVE:   DIAGNOSTIC FINDINGS: endplate deformities on the thoracic spine on MRI done years  ago  PATIENT SURVEYS:  FOTO 50  COGNITION: Overall cognitive status: Within functional limits for tasks assessed     SENSATION: Not tested  MUSCLE LENGTH: Moderate tightness on B pectorals Moderate tightness on R latissimus dorsi  POSTURE: rounded shoulders and forward head, L scapula slightly lower and downwardly rotated  PALPATION: No tenderness on general muscle mass and major bony landmarks of the thoracic area  CERVICAL AND UPPER EXTREMITY ROM:  WFL on all planes on cervical spine. WFL on all planes on B shoulders  TRUNK AROM: 25% deficit on trunk rotation to the R with pain across the R lat aspect of the trunk.  TRUNK MMT:  FLEXION AND EXTENSION: 3/5  LOWER EXTREMITY MMT:  MMT Right eval Left eval  Shoulder flexion 5/5 5/5  Shoulder abduction 5/5 5/5  Shoulder internal rotation 5/5 5/5  Shoulder external rotation 5/5 5/5   (Blank rows = not tested)   TODAY'S TREATMENT:                                                                                                                              DATE: 07/19/2022 Evaluation and patient education    PATIENT EDUCATION:  Education details: pathoanatomy of thoracic pain, reviewed goals and course of rehab Person educated: Patient Education method: Explanation Education comprehension: verbalized understanding  HOME EXERCISE PROGRAM: None provided today  ASSESSMENT:  CLINICAL IMPRESSION: Patient is a 67 y.o. female who was seen today for physical therapy evaluation and treatment for thoracic pain. Patient has difficulty with performing activities involving outstretched arms such as doing the dishes and laundry due to pain, weakness, and decreased trunk ROM. Skilled PT is required to address the impairments and functional limitations. Patient needs strengthening of upper back musculature and working on the upper chest and UE flexibility to facilitate ease in function  OBJECTIVE IMPAIRMENTS: decreased mobility,  decreased ROM, decreased strength, impaired flexibility, postural dysfunction, and pain.   ACTIVITY LIMITATIONS: carrying and lifting  PARTICIPATION LIMITATIONS: meal prep, cleaning, and laundry  PERSONAL FACTORS: 1 comorbidity:    are also affecting patient's functional outcome.   REHAB POTENTIAL: Good  CLINICAL DECISION MAKING: Stable/uncomplicated  EVALUATION COMPLEXITY: Low   GOALS: Goals reviewed with patient? Yes  SHORT TERM GOALS: Target date: 02/05/202 Patient will report decrease in pain to 3/10 to facilitate ease in house chores Baseline:6/10 Goal status: INITIAL  2.  Patient will receive and be indep in HEP  to facilitate carry-over of rehab goals and procedures Baseline:  Goal status: INITIAL  3.  Patient will demonstrate less than 25% deficit in trunk R rot AROM to facilitate ease in house chores Baseline: 25% deficit Goal status: INITIAL  4.  Patient will demonstrate mild tightness on pectorals and latissimus dorsi to facilitate ease in house chores Baseline: moderate tightness Goal status: INITIAL  LONG TERM GOALS: Target date: 08/16/2022  Patient will demonstrate increase in FOTO score to 60 Baseline: 50 Goal status: INITIAL  2.  Patient will report little to no difficulty/pain with activities that involve outstretched arms such as washing the dishes and doing the laundry Goal status: INITIAL  PLAN:  PT FREQUENCY: 1x/week (can only do 1x/week per patient)  PT DURATION: 4 weeks  PLANNED INTERVENTIONS: Therapeutic exercises, Therapeutic activity, Neuromuscular re-education, Balance training, Gait training, Patient/Family education, and Self Care  PLAN FOR NEXT SESSION: May initiate trunk ROM exercises and upper back strengthening exercises   1:41 PM, 07/19/22 Harlynn Kimbell Small Sharri Loya MPT [place] [facility] [place] [phone] [phone]

## 2022-07-28 ENCOUNTER — Ambulatory Visit (HOSPITAL_COMMUNITY): Payer: Medicare Other | Admitting: Physical Therapy

## 2022-07-28 DIAGNOSIS — M546 Pain in thoracic spine: Secondary | ICD-10-CM | POA: Diagnosis not present

## 2022-07-28 NOTE — Therapy (Signed)
OUTPATIENT PHYSICAL THERAPY TREATMENT   Patient Name: Kathy Zuniga MRN: [id number] DOB:1955/12/28, 67 y.o., female Today's Date: 07/28/2022  END OF SESSION:  PT End of Session - 07/28/22 0912     Visit Number 2    Number of Visits 4    Date for PT Re-Evaluation 08/16/22    Authorization Type BCBS Medicare; no auth required    Progress Note Due on Visit 10    PT Start Time 0905    PT Stop Time 0945    PT Time Calculation (min) 40 min    Activity Tolerance Patient tolerated treatment well    Behavior During Therapy [facility] for tasks assessed/performed             Past Medical History:  Diagnosis Date   Arthritis    Back pain    Fracture of coccyx ([place])    Frequent headaches    Migraines    No blood products 02/15/2017   Jehovah's Witness   No blood products    Jehovah Witness   Osteoporosis    Thyroid disease    Past Surgical History:  Procedure Laterality Date   BIOPSY  05/13/2021   Procedure: BIOPSY;  Surgeon: Harvel Quale, MD;  [facility];  Service: Gastroenterology;;   ENDOVENOUS ABLATION SAPHENOUS VEIN W/ LASER Right 04/01/2022   endovenous laser ablation right greater saphenous vein and stab phlebectomy 10-20 incisions right leg by Servando Snare MD   [place] N/A 05/13/2021   Procedure: Beryle Quant;  Surgeon: Harvel Quale, MD;  [facility];  Service: Gastroenterology;  Laterality: N/A;  1:45   WISDOM TOOTH EXTRACTION     Patient Active Problem List   Diagnosis Date Noted   Chronic venous insufficiency 08/25/2021   Rectal cancer ([place]) 04/27/2021   Refusal of blood transfusions as patient is Jehovah's Witness 07/05/2018   No blood products 02/15/2017   Radial styloid tenosynovitis 12/06/2016   Hemorrhoids 12/06/2016   Chronic midline thoracic back pain 12/06/2016   Chronic back pain 06/07/2016   Irritable bowel syndrome with diarrhea 06/07/2016   GERD without esophagitis  07/11/2015   Osteoporosis 07/11/2015    PCP: Valentino Nose, FNP  REFERRING PROVIDER: Ezequiel Essex, FNP  REFERRING DIAG: thoracic pain  THERAPY DIAG:  Thoracic spine pain  Rationale for Evaluation and Treatment: Rehabilitation  ONSET DATE: Condition started for years now without any reason but got aggravated months ago when she twisted her back to the L. Patient states that she also had PT in the past for the same issue which helped. Patient then went to her MD and was given muscle relaxers which are not helping. Patient currently is going to her chiropractor and reports that the treatment is helping her back.  SUBJECTIVE:   SUBJECTIVE STATEMENT: Currently without pain or issues.  Evaluation: Currently denies any pain at the time of eval. Patient reports that the pain on the upper/midback gets aggravated (6/10 on NPRS) when her arms are outstretched and she has to hold on to something. Pain is described as sharp, aching and burning and radiates to the right rib cage. Resting partially relieves (2/10 on NPRS). Patient also reports that the area in the midback is numb.  PERTINENT HISTORY: Osteoporosis, hx of breast CA PAIN:  Are you having pain? No  PRECAUTIONS: osteoporosis  WEIGHT BEARING RESTRICTIONS: No  FALLS: None Has patient fallen in last 6 months? No  LIVING ENVIRONMENT: Lives with: lives with their family and lives with their  spouse Lives in: House/apartment Stairs: Yes: External: 3 steps; can reach both Has following equipment at home: None  OCCUPATION: retired  PLOF: Independent  PATIENT GOALS: Patient wants to get rid of the pain   OBJECTIVE:   DIAGNOSTIC FINDINGS: endplate deformities on the thoracic spine on MRI done years ago  PATIENT SURVEYS:  FOTO 50  COGNITION: Overall cognitive status: Within functional limits for tasks assessed     SENSATION: Not tested  MUSCLE LENGTH: Moderate tightness on B pectorals Moderate tightness on R  latissimus dorsi  POSTURE: rounded shoulders and forward head, L scapula slightly lower and downwardly rotated  PALPATION: No tenderness on general muscle mass and major bony landmarks of the thoracic area  CERVICAL AND UPPER EXTREMITY ROM:  WFL on all planes on cervical spine. WFL on all planes on B shoulders  TRUNK AROM: 25% deficit on trunk rotation to the R with pain across the R lat aspect of the trunk.  TRUNK MMT:  FLEXION AND EXTENSION: 3/5  LOWER EXTREMITY MMT:  MMT Right eval Left eval  Shoulder flexion 5/5 5/5  Shoulder abduction 5/5 5/5  Shoulder internal rotation 5/5 5/5  Shoulder external rotation 5/5 5/5   (Blank rows = not tested)   TODAY'S TREATMENT:                                                                                                                              DATE:  07/28/22 Standing:corner stretch 3X30"  GTB rows, retractions and extensions 10X each  3D lumbar/hip excursion 10X each movement Supine:  abdominal bracing 10X5"  Bridge 10X  SLR 10X each   07/19/2022 Evaluation and patient education    PATIENT EDUCATION:  Education details: pathoanatomy of thoracic pain, reviewed goals and course of rehab Person educated: Patient Education method: Explanation Education comprehension: verbalized understanding  HOME EXERCISE PROGRAM: At evaluation:  None provided today  Access Code: [email or web address] URL: [email or web address] Date: 07/28/2022 Prepared by: Roseanne Reno Exercises 3D hip/lumbar excursions 10X each movement - Supine Transversus Abdominis Bracing - Hands on Stomach  - 2 x daily - 7 x weekly - 2 sets - 10 reps - 5 seconds hold - Beginner Bridge  - 2 x daily - 7 x weekly - 2 sets - 10 reps - 5 seconds hold - Straight Leg Raise  - 2 x daily - 7 x weekly - 2 sets - 10 reps - Corner Pec Major Stretch  - 2 x daily - 7 x weekly - 2 sets - 3 reps - 30 second hold - Standing Row with Resistance with Anchored Resistance at  Chest Height Palms Down  - 2 x daily - 7 x weekly - 2 sets - 10 reps - Standing Bilateral Low Shoulder Row with Anchored Resistance  - 2 x daily - 7 x weekly - 2 sets - 10 reps - Shoulder Extension with Resistance  - 2 x daily - 7 x weekly - 2 sets -  10 reps ASSESSMENT:  CLINICAL IMPRESSION: Goals reviewed and POC moving forward.  Began exercises to address deficits including lumbar mobility, core and upper body strengthening.  Initiated HEP to include added exercises this session.  Pt able to complete these with general cues for form, hold times and fluid movement.  Pt without any pain reported while completing activities or at end of session.  Pt will continue to benefit from skilled therapy to address deficits, reduce pain and increase function.   OBJECTIVE IMPAIRMENTS: decreased mobility, decreased ROM, decreased strength, impaired flexibility, postural dysfunction, and pain.   ACTIVITY LIMITATIONS: carrying and lifting  PARTICIPATION LIMITATIONS: meal prep, cleaning, and laundry  PERSONAL FACTORS: 1 comorbidity:    are also affecting patient's functional outcome.   REHAB POTENTIAL: Good  CLINICAL DECISION MAKING: Stable/uncomplicated  EVALUATION COMPLEXITY: Low   GOALS: Goals reviewed with patient? Yes  SHORT TERM GOALS: Target date: 02/05/202 Patient will report decrease in pain to 3/10 to facilitate ease in house chores Baseline:6/10 Goal status: IN PROGRESS  2.  Patient will receive and be indep in HEP to facilitate carry-over of rehab goals and procedures Baseline:  Goal status: IN PROGRESS  3.  Patient will demonstrate less than 25% deficit in trunk R rot AROM to facilitate ease in house chores Baseline: 25% deficit Goal status: IN PROGRESS  4.  Patient will demonstrate mild tightness on pectorals and latissimus dorsi to facilitate ease in house chores Baseline: moderate tightness Goal status: IN PROGRESS  LONG TERM GOALS: Target date: 08/16/2022  Patient will  demonstrate increase in FOTO score to 60 Baseline: 50 Goal status: IN PROGRESS  2.  Patient will report little to no difficulty/pain with activities that involve outstretched arms such as washing the dishes and doing the laundry Goal status: IN PROGRESS  PLAN:  PT FREQUENCY: 1x/week (can only do 1x/week per patient)  PT DURATION: 4 weeks  PLANNED INTERVENTIONS: Therapeutic exercises, Therapeutic activity, Neuromuscular re-education, Balance training, Gait training, Patient/Family education, and Self Care  PLAN FOR NEXT SESSION: Progress trunk ROM and upper back strengthening exercises. Update HEP as coming only 1X week.   9:13 AM, 07/28/22 Teena Irani, PTA/CLT [place] Ph: [phone]

## 2022-08-02 DIAGNOSIS — M9903 Segmental and somatic dysfunction of lumbar region: Secondary | ICD-10-CM | POA: Diagnosis not present

## 2022-08-02 DIAGNOSIS — M62830 Muscle spasm of back: Secondary | ICD-10-CM | POA: Diagnosis not present

## 2022-08-02 DIAGNOSIS — M546 Pain in thoracic spine: Secondary | ICD-10-CM | POA: Diagnosis not present

## 2022-08-02 DIAGNOSIS — M9902 Segmental and somatic dysfunction of thoracic region: Secondary | ICD-10-CM | POA: Diagnosis not present

## 2022-08-04 ENCOUNTER — Ambulatory Visit (HOSPITAL_COMMUNITY): Payer: Medicare Other | Attending: Family Medicine | Admitting: Physical Therapy

## 2022-08-04 DIAGNOSIS — M546 Pain in thoracic spine: Secondary | ICD-10-CM | POA: Diagnosis not present

## 2022-08-04 NOTE — Therapy (Signed)
OUTPATIENT PHYSICAL THERAPY TREATMENT   Patient Name: Kathy Zuniga MRN: [id number] DOB:[birth year], 67 y.o., female Today's Date: 08/04/2022  END OF SESSION:  PT End of Session - 08/04/22 1248     Visit Number 3    Number of Visits 4    Date for PT Re-Evaluation 08/16/22    Authorization Type BCBS Medicare; no auth required    Progress Note Due on Visit 10    PT Start Time 1036    PT Stop Time 1115    PT Time Calculation (min) 39 min    Activity Tolerance Patient tolerated treatment well    Behavior During Therapy WFL for tasks assessed/performed             Past Medical History:  Diagnosis Date   Arthritis    Back pain    Fracture of coccyx ([place])    Frequent headaches    Migraines    No blood products 02/15/2017   Jehovah's Witness   No blood products    Jehovah Witness   Osteoporosis    Thyroid disease    Past Surgical History:  Procedure Laterality Date   BIOPSY  05/13/2021   Procedure: BIOPSY;  Surgeon: Harvel Quale, MD;  [facility];  Service: Gastroenterology;;   ENDOVENOUS ABLATION SAPHENOUS VEIN W/ LASER Right 04/01/2022   endovenous laser ablation right greater saphenous vein and stab phlebectomy 10-20 incisions right leg by Servando Snare MD   [place] N/A 05/13/2021   Procedure: Beryle Quant;  Surgeon: Harvel Quale, MD;  Location: [facility];  Service: Gastroenterology;  Laterality: N/A;  1:45   WISDOM TOOTH EXTRACTION     Patient Active Problem List   Diagnosis Date Noted   Chronic venous insufficiency 08/25/2021   Rectal cancer ([place]) 04/27/2021   Refusal of blood transfusions as patient is Jehovah's Witness 07/05/2018   No blood products 02/15/2017   Radial styloid tenosynovitis 12/06/2016   Hemorrhoids 12/06/2016   Chronic midline thoracic back pain 12/06/2016   Chronic back pain 06/07/2016   Irritable bowel syndrome with diarrhea 06/07/2016   GERD without esophagitis  07/11/2015   Osteoporosis 07/11/2015    PCP: Valentino Nose, FNP  REFERRING PROVIDER: Ezequiel Essex, FNP  REFERRING DIAG: thoracic pain  THERAPY DIAG:  Thoracic spine pain  Rationale for Evaluation and Treatment: Rehabilitation  ONSET DATE: Condition started for years now without any reason but got aggravated months ago when she twisted her back to the L. Patient states that she also had PT in the past for the same issue which helped. Patient then went to her MD and was given muscle relaxers which are not helping. Patient currently is going to her chiropractor and reports that the treatment is helping her back.  SUBJECTIVE:   SUBJECTIVE STATEMENT: Pt states she can tell it is getting better.  Still having the sharp pain in her Rt thoracic region with certain movements/activities.  Otherwise doing well.   Evaluation: Currently denies any pain at the time of eval. Patient reports that the pain on the upper/midback gets aggravated (6/10 on NPRS) when her arms are outstretched and she has to hold on to something. Pain is described as sharp, aching and burning and radiates to the right rib cage. Resting partially relieves (2/10 on NPRS). Patient also reports that the area in the midback is numb.  PERTINENT HISTORY: Osteoporosis, hx of breast CA PAIN:  Are you having pain? No  PRECAUTIONS: osteoporosis  WEIGHT BEARING RESTRICTIONS: No  FALLS: None Has patient fallen in last 6 months? No  LIVING ENVIRONMENT: Lives with: lives with their family and lives with their spouse Lives in: House/apartment Stairs: Yes: External: 3 steps; can reach both Has following equipment at home: None  OCCUPATION: retired  PLOF: Independent  PATIENT GOALS: Patient wants to get rid of the pain   OBJECTIVE:   DIAGNOSTIC FINDINGS: endplate deformities on the thoracic spine on MRI done years ago  PATIENT SURVEYS:  FOTO 50  COGNITION: Overall cognitive status: Within functional limits for  tasks assessed     SENSATION: Not tested  MUSCLE LENGTH: Moderate tightness on B pectorals Moderate tightness on R latissimus dorsi  POSTURE: rounded shoulders and forward head, L scapula slightly lower and downwardly rotated  PALPATION: No tenderness on general muscle mass and major bony landmarks of the thoracic area  CERVICAL AND UPPER EXTREMITY ROM:  WFL on all planes on cervical spine. WFL on all planes on B shoulders  TRUNK AROM: 25% deficit on trunk rotation to the R with pain across the R lat aspect of the trunk.  TRUNK MMT:  FLEXION AND EXTENSION: 3/5  LOWER EXTREMITY MMT:  MMT Right eval Left eval  Shoulder flexion 5/5 5/5  Shoulder abduction 5/5 5/5  Shoulder internal rotation 5/5 5/5  Shoulder external rotation 5/5 5/5   (Blank rows = not tested)   TODAY'S TREATMENT:                                                                                                                              DATE:  08/04/22 UBE 3 minutes backward level 1 Seated:  Thoracic excursions with UE movements 5X each Standing:  wall push ups 10X Prone:  rows 10X  Shoulder extension 10X  Torso extension 2X5 Prone manual to thoracic spine, paraspinals, scapular mm  07/28/22 Standing:corner stretch 3X30"  GTB rows, retractions and extensions 10X each  3D lumbar/hip excursion 10X each movement  Corner stretch 3X30" Supine:  abdominal bracing 10X5"  Bridge 10X  SLR 10X each   07/19/2022 Evaluation and patient education    PATIENT EDUCATION:  Education details: pathoanatomy of thoracic pain, reviewed goals and course of rehab Person educated: Patient Education method: Explanation Education comprehension: verbalized understanding  HOME EXERCISE PROGRAM: At evaluation:  None provided today  Access Code: [email or web address] URL: [email or web address] Date: 07/28/2022 Prepared by: Roseanne Reno Exercises 3D hip/lumbar excursions 10X each movement - Supine Transversus  Abdominis Bracing - Hands on Stomach  - 2 x daily - 7 x weekly - 2 sets - 10 reps - 5 seconds hold - Beginner Bridge  - 2 x daily - 7 x weekly - 2 sets - 10 reps - 5 seconds hold - Straight Leg Raise  - 2 x daily - 7 x weekly - 2 sets - 10 reps - Corner Pec Major Stretch  - 2 x daily - 7 x weekly - 2 sets - 3 reps - 30 second  hold - Standing Row with Resistance with Anchored Resistance at Chest Height Palms Down  - 2 x daily - 7 x weekly - 2 sets - 10 reps - Standing Bilateral Low Shoulder Row with Anchored Resistance  - 2 x daily - 7 x weekly - 2 sets - 10 reps - Shoulder Extension with Resistance  - 2 x daily - 7 x weekly - 2 sets - 10 reps ASSESSMENT:  CLINICAL IMPRESSION: Began with UBE for mm warmup and progressed to thoracic mobility exercises.  These were given to patient to add to HEP.  Instructed with scapular stabilization and strengthening exercises with ability to complete in good form with minimal cues and no pain.  Manual completed to upper spine with tight mm bands along bil paraspinals and multiple spasms in Rt upper trap/scap region.  Encouraged to get spouse to use ball (demonstrated) to this area to help reduce spasm.  Pt reported overall improvement following manual.  Pt will continue to benefit from skilled therapy to address deficits, reduce pain and increase function.   OBJECTIVE IMPAIRMENTS: decreased mobility, decreased ROM, decreased strength, impaired flexibility, postural dysfunction, and pain.   ACTIVITY LIMITATIONS: carrying and lifting  PARTICIPATION LIMITATIONS: meal prep, cleaning, and laundry  PERSONAL FACTORS: 1 comorbidity:    are also affecting patient's functional outcome.   REHAB POTENTIAL: Good  CLINICAL DECISION MAKING: Stable/uncomplicated  EVALUATION COMPLEXITY: Low   GOALS: Goals reviewed with patient? Yes  SHORT TERM GOALS: Target date: 02/05/202 Patient will report decrease in pain to 3/10 to facilitate ease in house  chores Baseline:6/10 Goal status: IN PROGRESS  2.  Patient will receive and be indep in HEP to facilitate carry-over of rehab goals and procedures Baseline:  Goal status: IN PROGRESS  3.  Patient will demonstrate less than 25% deficit in trunk R rot AROM to facilitate ease in house chores Baseline: 25% deficit Goal status: IN PROGRESS  4.  Patient will demonstrate mild tightness on pectorals and latissimus dorsi to facilitate ease in house chores Baseline: moderate tightness Goal status: IN PROGRESS  LONG TERM GOALS: Target date: 08/16/2022  Patient will demonstrate increase in FOTO score to 60 Baseline: 50 Goal status: IN PROGRESS  2.  Patient will report little to no difficulty/pain with activities that involve outstretched arms such as washing the dishes and doing the laundry Goal status: IN PROGRESS  PLAN:  PT FREQUENCY: 1x/week (can only do 1x/week per patient)  PT DURATION: 4 weeks  PLANNED INTERVENTIONS: Therapeutic exercises, Therapeutic activity, Neuromuscular re-education, Balance training, Gait training, Patient/Family education, and Self Care  PLAN FOR NEXT SESSION: Progress trunk ROM and upper back strengthening exercises. Update HEP as coming only 1X week.   12:50 PM, 08/04/22 Teena Irani, PTA/CLT [place] Ph: [phone]

## 2022-08-11 ENCOUNTER — Ambulatory Visit (HOSPITAL_COMMUNITY): Payer: Medicare Other

## 2022-08-11 DIAGNOSIS — M546 Pain in thoracic spine: Secondary | ICD-10-CM | POA: Diagnosis not present

## 2022-08-11 NOTE — Therapy (Signed)
OUTPATIENT PHYSICAL THERAPY TREATMENT/ DISCHARGE  PHYSICAL THERAPY DISCHARGE SUMMARY  Visits from Start of Care: 4  Current functional level related to goals / functional outcomes: See below   Remaining deficits: See below   Education / Equipment: See below   Patient agrees to discharge. Patient goals were met. Patient is being discharged due to meeting the stated rehab goals.    Patient Name: Kathy Zuniga MRN: [id number] DOB:1956-04-02, 67 y.o., female Today's Date: 08/11/2022  END OF SESSION:  PT End of Session - 08/11/22 0950     Visit Number 4    Number of Visits 4    Date for PT Re-Evaluation 08/16/22    Authorization Type BCBS Medicare; no auth required    Progress Note Due on Visit 10    PT Start Time 0950    PT Stop Time 1030    PT Time Calculation (min) 40 min    Activity Tolerance Patient tolerated treatment well    Behavior During Therapy [facility] for tasks assessed/performed             Past Medical History:  Diagnosis Date   Arthritis    Back pain    Fracture of coccyx ([place])    Frequent headaches    Migraines    No blood products 02/15/2017   Jehovah's Witness   No blood products    Jehovah Witness   Osteoporosis    Thyroid disease    Past Surgical History:  Procedure Laterality Date   BIOPSY  05/13/2021   Procedure: BIOPSY;  Surgeon: Harvel Quale, MD;  [facility];  Service: Gastroenterology;;   ENDOVENOUS ABLATION SAPHENOUS VEIN W/ LASER Right 04/01/2022   endovenous laser ablation right greater saphenous vein and stab phlebectomy 10-20 incisions right leg by Servando Snare MD   [place] N/A 05/13/2021   Procedure: Beryle Quant;  Surgeon: Harvel Quale, MD;  [facility];  Service: Gastroenterology;  Laterality: N/A;  1:45   WISDOM TOOTH EXTRACTION     Patient Active Problem List   Diagnosis Date Noted   Chronic venous insufficiency 08/25/2021   Rectal cancer  ([place]) 04/27/2021   Refusal of blood transfusions as patient is Jehovah's Witness 07/05/2018   No blood products 02/15/2017   Radial styloid tenosynovitis 12/06/2016   Hemorrhoids 12/06/2016   Chronic midline thoracic back pain 12/06/2016   Chronic back pain 06/07/2016   Irritable bowel syndrome with diarrhea 06/07/2016   GERD without esophagitis 07/11/2015   Osteoporosis 07/11/2015    PCP: Valentino Nose, FNP  REFERRING PROVIDER: Ezequiel Essex, FNP  REFERRING DIAG: thoracic pain  THERAPY DIAG:  Thoracic spine pain  Rationale for Evaluation and Treatment: Rehabilitation  ONSET DATE: Condition started for years now without any reason but got aggravated months ago when she twisted her back to the L. Patient states that she also had PT in the past for the same issue which helped. Patient then went to her MD and was given muscle relaxers which are not helping. Patient currently is going to her chiropractor and reports that the treatment is helping her back.  SUBJECTIVE:   SUBJECTIVE STATEMENT: Feeling better; moving better and has more energy.  "50% better"; 0/10 pain on patient arrival.  2/10 earlier with exercise  Evaluation: Currently denies any pain at the time of eval. Patient reports that the pain on the upper/midback gets aggravated (6/10 on NPRS) when her arms are outstretched and she has to hold on to something. Pain is described  as sharp, aching and burning and radiates to the right rib cage. Resting partially relieves (2/10 on NPRS). Patient also reports that the area in the midback is numb.  PERTINENT HISTORY: Osteoporosis, hx of breast CA PAIN:  Are you having pain? No  PRECAUTIONS: osteoporosis  WEIGHT BEARING RESTRICTIONS: No  FALLS: None Has patient fallen in last 6 months? No  LIVING ENVIRONMENT: Lives with: lives with their family and lives with their spouse Lives in: House/apartment Stairs: Yes: External: 3 steps; can reach both Has following  equipment at home: None  OCCUPATION: retired  PLOF: Independent  PATIENT GOALS: Patient wants to get rid of the pain   OBJECTIVE:   DIAGNOSTIC FINDINGS: endplate deformities on the thoracic spine on MRI done years ago  PATIENT SURVEYS:  FOTO 50  COGNITION: Overall cognitive status: Within functional limits for tasks assessed     SENSATION: Not tested  MUSCLE LENGTH: Moderate tightness on B pectorals Moderate tightness on R latissimus dorsi  POSTURE: rounded shoulders and forward head, L scapula slightly lower and downwardly rotated  PALPATION: No tenderness on general muscle mass and major bony landmarks of the thoracic area  CERVICAL AND UPPER EXTREMITY ROM:  WFL on all planes on cervical spine. WFL on all planes on B shoulders  TRUNK AROM: 25% deficit on trunk rotation to the R with pain across the R lat aspect of the trunk.  TRUNK MMT:  FLEXION AND EXTENSION: 3/5  LOWER EXTREMITY MMT:  MMT Right eval Left eval  Shoulder flexion 5/5 5/5  Shoulder abduction 5/5 5/5  Shoulder internal rotation 5/5 5/5  Shoulder external rotation 5/5 5/5   (Blank rows = not tested)   TODAY'S TREATMENT:                                                                                                                              DATE:  08/11/22 Progress note FOTO 62 Thoracic AROM wfl thoughout  UBE backwards x 3'  Standing: BTB scapular retraction 2 x 10 BTB shoulder extension 2 x 10 Wall push ups 2 x 10 Doorway stretch 3 x 20"  Review of HEP    08/04/22 UBE 3 minutes backward level 1 Seated:  Thoracic excursions with UE movements 5X each Standing:  wall push ups 10X Prone:  rows 10X  Shoulder extension 10X  Torso extension 2X5 Prone manual to thoracic spine, paraspinals, scapular mm  07/28/22 Standing:corner stretch 3X30"  GTB rows, retractions and extensions 10X each  3D lumbar/hip excursion 10X each movement  Corner stretch 3X30" Supine:  abdominal  bracing 10X5"  Bridge 10X  SLR 10X each   07/19/2022 Evaluation and patient education    PATIENT EDUCATION:  Education details: pathoanatomy of thoracic pain, reviewed goals and course of rehab Person educated: Patient Education method: Explanation Education comprehension: verbalized understanding  HOME EXERCISE PROGRAM: At evaluation:  None provided today  Access Code: [email or web address] URL: [email or web address] Date: 07/28/2022 Prepared by: Roseanne Reno  Exercises 3D hip/lumbar excursions 10X each movement - Supine Transversus Abdominis Bracing - Hands on Stomach  - 2 x daily - 7 x weekly - 2 sets - 10 reps - 5 seconds hold - Beginner Bridge  - 2 x daily - 7 x weekly - 2 sets - 10 reps - 5 seconds hold - Straight Leg Raise  - 2 x daily - 7 x weekly - 2 sets - 10 reps - Corner Pec Major Stretch  - 2 x daily - 7 x weekly - 2 sets - 3 reps - 30 second hold - Standing Row with Resistance with Anchored Resistance at Chest Height Palms Down  - 2 x daily - 7 x weekly - 2 sets - 10 reps - Standing Bilateral Low Shoulder Row with Anchored Resistance  - 2 x daily - 7 x weekly - 2 sets - 10 reps - Shoulder Extension with Resistance  - 2 x daily - 7 x weekly - 2 sets - 10 reps ASSESSMENT:  CLINICAL IMPRESSION: Progress note today.  Met FOTO goal.    All set rehab goals met and patient agreeable to discharge at this time.  Issued blue theraband for home use.   OBJECTIVE IMPAIRMENTS: decreased mobility, decreased ROM, decreased strength, impaired flexibility, postural dysfunction, and pain.   ACTIVITY LIMITATIONS: carrying and lifting  PARTICIPATION LIMITATIONS: meal prep, cleaning, and laundry  PERSONAL FACTORS: 1 comorbidity:    are also affecting patient's functional outcome.   REHAB POTENTIAL: Good  CLINICAL DECISION MAKING: Stable/uncomplicated  EVALUATION COMPLEXITY: Low   GOALS: Goals reviewed with patient? Yes  SHORT TERM GOALS: Target date: 08/02/2022 Patient  will report decrease in pain to 3/10 to facilitate ease in house chores Baseline:6/10; 0/10 to 2/10 with exercise 08/11/22 Goal status: MET  2.  Patient will receive and be indep in HEP to facilitate carry-over of rehab goals and procedures Baseline:  Goal status: MET  3.  Patient will demonstrate less than 25% deficit in trunk R rot AROM to facilitate ease in house chores Baseline: 25% deficit Goal status: MET  4.  Patient will demonstrate mild tightness on pectorals and latissimus dorsi to facilitate ease in house chores Baseline: moderate tightness Goal status: MET  LONG TERM GOALS: Target date: 08/16/2022  Patient will demonstrate increase in FOTO score to 60 Baseline: 50 Goal status: MET  2.  Patient will report little to no difficulty/pain with activities that involve outstretched arms such as washing the dishes and doing the laundry Goal status: MET  PLAN:  PT FREQUENCY: 1x/week (can only do 1x/week per patient)  PT DURATION: 4 weeks  PLANNED INTERVENTIONS: Therapeutic exercises, Therapeutic activity, Neuromuscular re-education, Balance training, Gait training, Patient/Family education, and Self Care  PLAN FOR NEXT SESSION: discharge  10:30 AM, 08/11/22 Audryanna Zurita Small Carloyn Lahue MPT [facility] [place] [phone] [id number]

## 2022-08-17 ENCOUNTER — Encounter (HOSPITAL_COMMUNITY): Payer: Medicare Other

## 2022-08-24 DIAGNOSIS — Z91018 Allergy to other foods: Secondary | ICD-10-CM | POA: Diagnosis not present

## 2022-08-24 DIAGNOSIS — Z886 Allergy status to analgesic agent status: Secondary | ICD-10-CM | POA: Diagnosis not present

## 2022-08-24 DIAGNOSIS — E213 Hyperparathyroidism, unspecified: Secondary | ICD-10-CM | POA: Diagnosis not present

## 2022-08-24 DIAGNOSIS — E049 Nontoxic goiter, unspecified: Secondary | ICD-10-CM | POA: Diagnosis not present

## 2022-08-24 DIAGNOSIS — E042 Nontoxic multinodular goiter: Secondary | ICD-10-CM | POA: Diagnosis not present

## 2022-08-27 DIAGNOSIS — E213 Hyperparathyroidism, unspecified: Secondary | ICD-10-CM | POA: Diagnosis not present

## 2022-09-07 DIAGNOSIS — E210 Primary hyperparathyroidism: Secondary | ICD-10-CM | POA: Diagnosis not present

## 2022-09-08 DIAGNOSIS — H5203 Hypermetropia, bilateral: Secondary | ICD-10-CM | POA: Diagnosis not present

## 2022-09-27 DIAGNOSIS — H524 Presbyopia: Secondary | ICD-10-CM | POA: Diagnosis not present

## 2022-11-09 DIAGNOSIS — K080 Exfoliation of teeth due to systemic causes: Secondary | ICD-10-CM | POA: Diagnosis not present

## 2022-11-11 DIAGNOSIS — K080 Exfoliation of teeth due to systemic causes: Secondary | ICD-10-CM | POA: Diagnosis not present

## 2022-11-17 IMAGING — MR MR PELVIS W/O CM
10 series · 48 of 48 positions shown · non-contrast
Comparison: Exam from October 15, 2020 and previous outside MRI of
October 05, 2019.

CLINICAL DATA: Colorectal cancer surveillance.

EXAM:
MRI PELVIS WITHOUT CONTRAST
TECHNIQUE: Multiplanar multisequence MR imaging of the pelvis was performed. No
intravenous contrast was administered. Ultrasound gel was
administered per rectum to optimize tumor evaluation.

[Series 3: T2 · sagittal · 3.0mm · 1.19mm/px · 5 of 35 slices shown (1 of 5)]
[im 1/35]
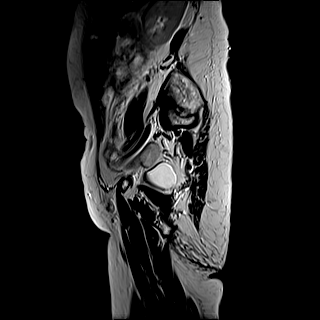
[im 9/35]
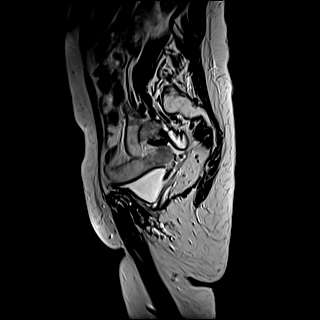
[im 18/35]
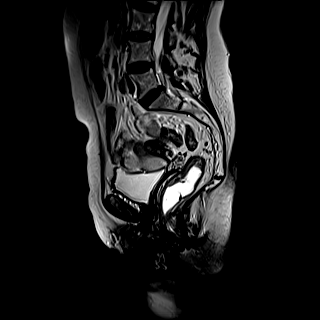
[im 26/35]
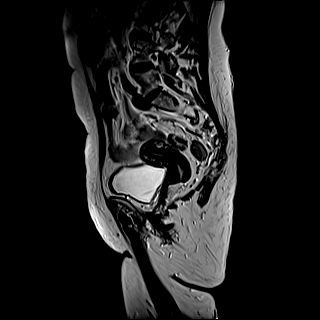
[im 35/35]
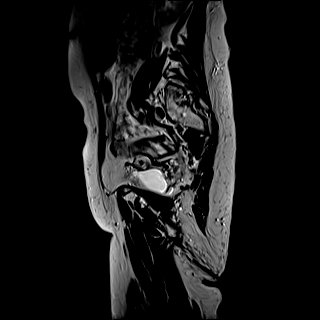

[Series 4: T2 · coronal · 3.0mm · 0.56mm/px · 6 of 45 slices shown (2 of 5)]
[im 1/45]
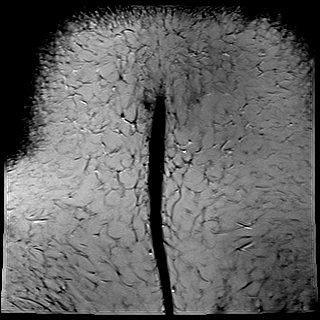
[im 9/45]
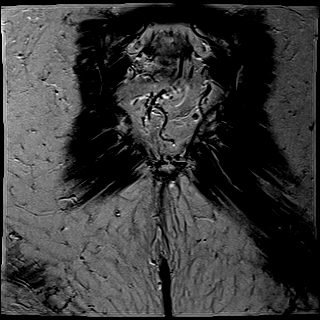
[im 18/45]
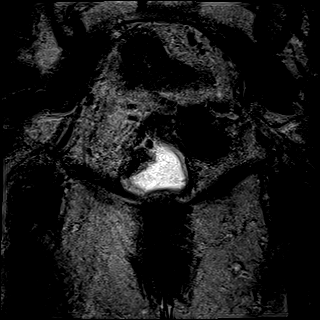
[im 27/45]
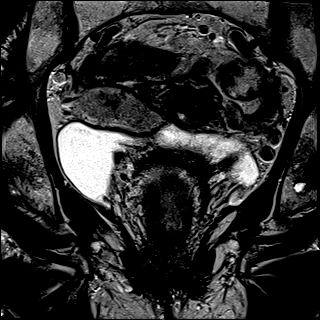
[im 36/45]
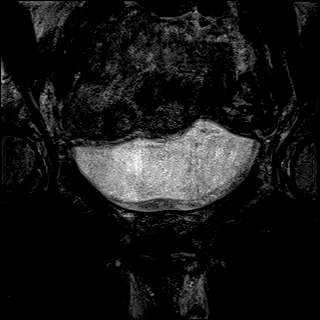
[im 45/45]
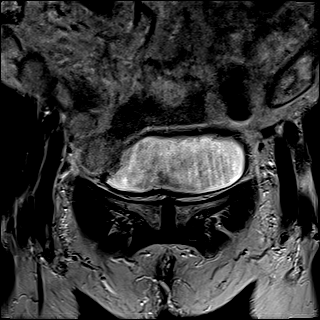

[Series 5: T2 · axial · 5.0mm · 1.19mm/px · z∈[-133,+83]mm · 5 of 37 slices shown (3 of 5)]
[im 1/37]
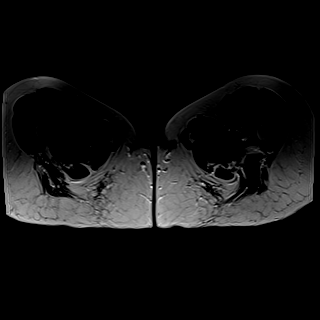
[im 10/37]
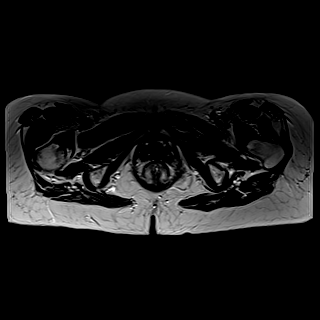
[im 19/37]
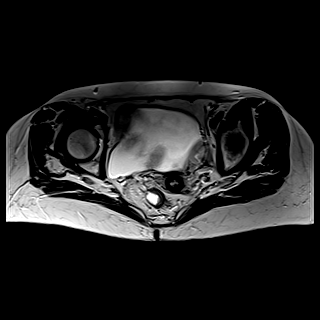
[im 28/37]
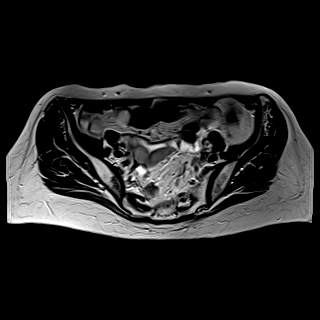
[im 37/37]
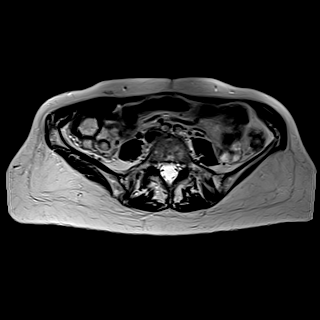

[Series 6: ax dwi_tracew · axial · 5.0mm · 1.48mm/px · z∈[-75,+70]mm · 4 of 30 slices shown (1 of 3)]
[im 1/30]
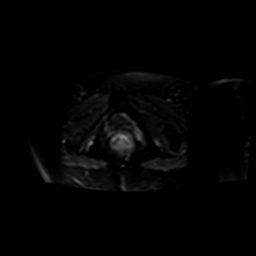
[im 10/30]
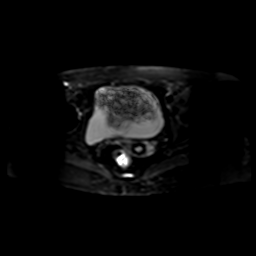
[im 20/30]
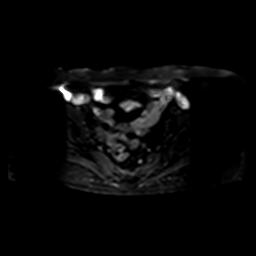
[im 30/30]
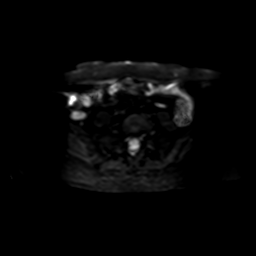

[Series 6: ax dwi_tracew · axial · 5.0mm · 1.48mm/px · z∈[-75,+70]mm · 4 of 30 slices shown (2 of 3)]
[im 1/30]
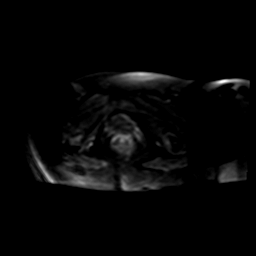
[im 10/30]
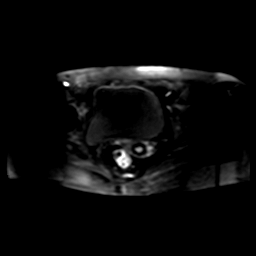
[im 20/30]
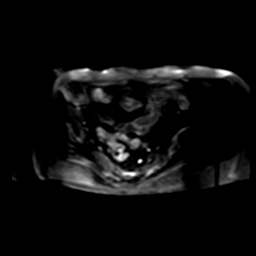
[im 30/30]
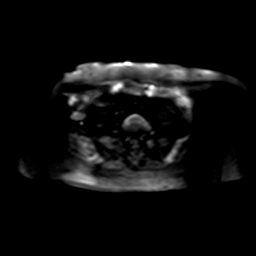

[Series 6: ax dwi_tracew · axial · 5.0mm · 1.48mm/px · z∈[-75,+70]mm · 4 of 30 slices shown (3 of 3)]
[im 1/30]
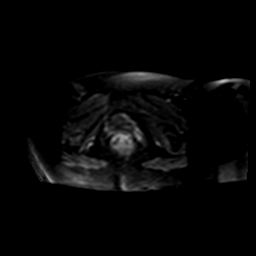
[im 10/30]
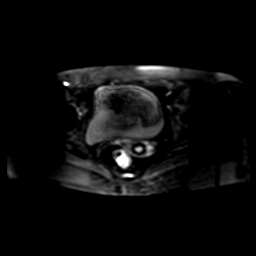
[im 20/30]
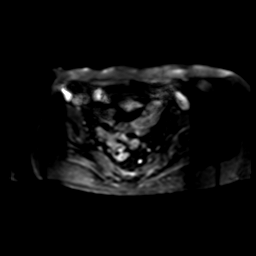
[im 30/30]
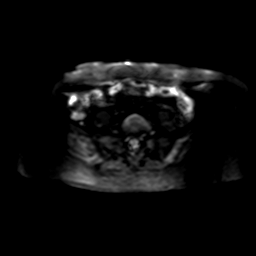

[Series 7: ax dwi_adc · axial · 5.0mm · 1.48mm/px · z∈[-75,+70]mm · 4 of 30 slices shown]
[im 1/30]
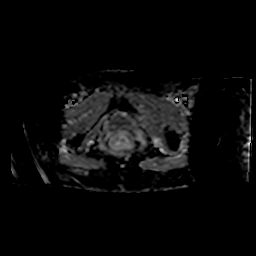
[im 10/30]
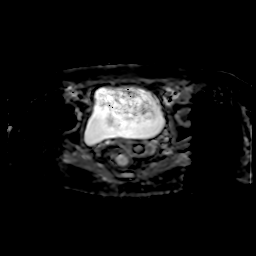
[im 20/30]
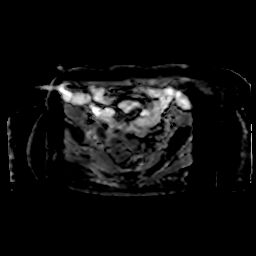
[im 30/30]
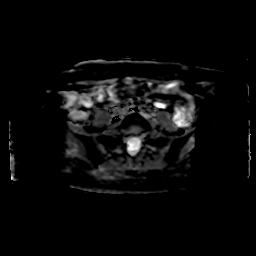

[Series 8: ax dwi_calc_bval · axial · 5.0mm · 1.48mm/px · z∈[-75,+70]mm · 4 of 30 slices shown]
[im 1/30]
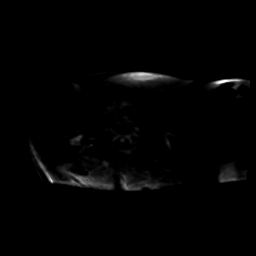
[im 10/30]
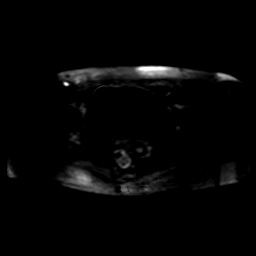
[im 20/30]
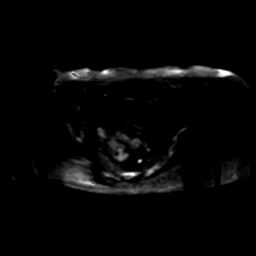
[im 30/30]
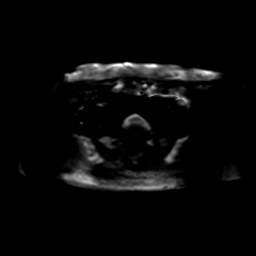

[Series 9: T2 · axial · 3.0mm · 0.56mm/px · z∈[-81,+50]mm · 6 of 45 slices shown (4 of 5)]
[im 1/45]
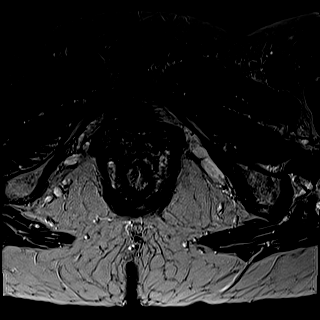
[im 9/45]
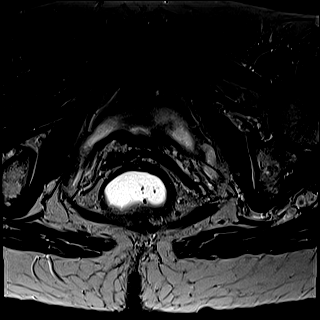
[im 18/45]
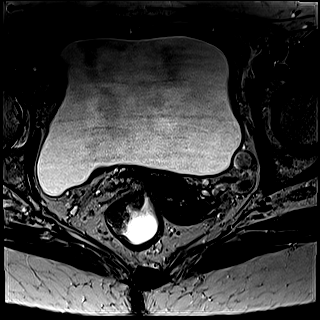
[im 27/45]
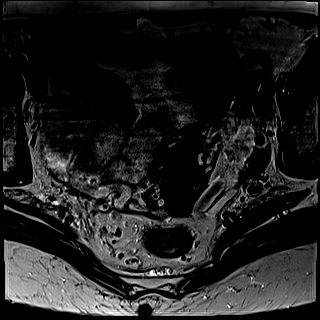
[im 36/45]
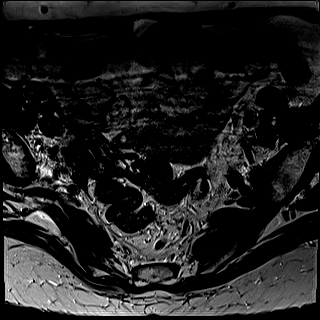
[im 45/45]
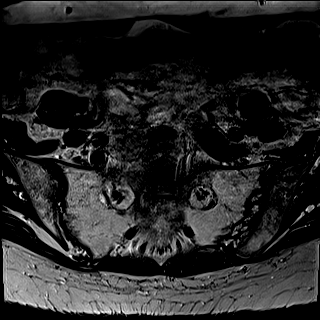

[Series 10: T2 · coronal · 3.0mm · 0.56mm/px · 6 of 45 slices shown (5 of 5)]
[im 1/45]
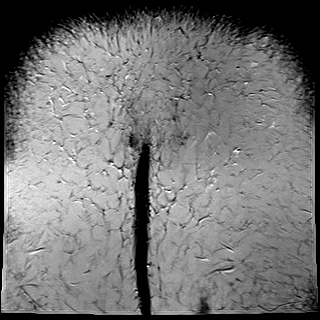
[im 9/45]
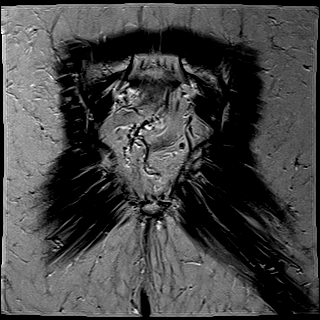
[im 18/45]
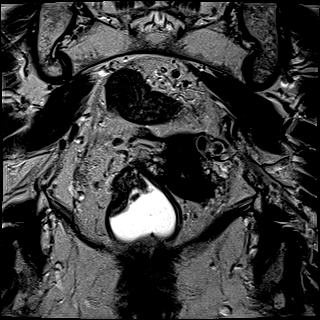
[im 27/45]
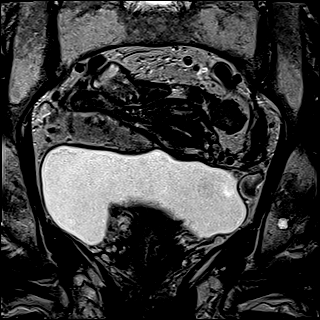
[im 36/45]
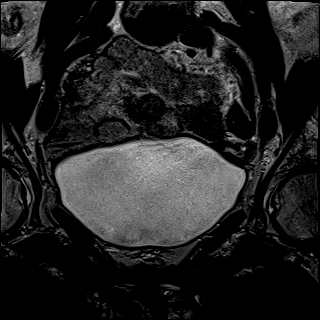
[im 45/45]
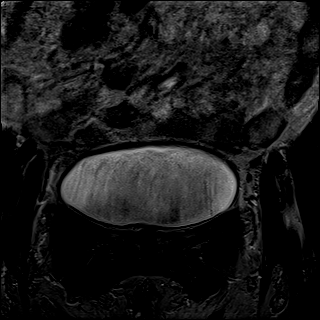

[48 of 48 positions shown; findings below may reference images not displayed]

FINDINGS: TUMOR LOCATION

Tumor distance from Anal Verge/Skin Surface:  7.5-8 cm

Tumor distance to Internal Anal Sphincter: 3.5-4 cm

Thickening along the RIGHT lateral rectum with distortion of the
rectum is noted and better evaluated than on the previous exam but
still limited by the presence of rectal spasm some increased
thickening is noted compared to the study October 15, 2020 along
the RIGHT lateral rectal wall measuring approximately 10 mm greatest
thickness, increased compared to the most recent study and improved
still when compared to the prior exam (image [DATE]) distortion of the
rectal wall at this level is noted with "puckering" of the rectum at
the level of tumor very subtle but apparent on the study October 15, 2020. Perhaps 7 mm on the previous exam at this location.

TUMOR DESCRIPTION

Circumferential Extent: Eccentric approximately 160 degrees
involvement of the circumference of the rectum tracking from
approximately the 7 o'clock position to the 12 o'clock position.

Tumor Length: 3 cm

T - CATEGORY

Extension through Muscularis Propria: Yes, approximately 3-4 mm
extension beyond the muscularis (image [DATE]).

Shortest Distance of any tumor/node from Mesorectal Fascia: 3 mm for
tumor and a 5 mm lymph node on image 19 of series 9 within 2 mm of
the mesorectal fascia.

Extramural Vascular Invasion/Tumor Thrombus: No

Invasion of Anterior Peritoneal Reflection: No

Involvement of Adjacent Organs or Pelvic Sidewall: No

Levator Ani Involvement: No

N - CATEGORY

Mesorectal Lymph Nodes >=5mm: N2, as before largest node
approximately 7 mm. Lymph nodes localized to the RIGHT mesorectum.

Small lymph nodes along the superior rectal vein still regional
largest at 6 mm (image [DATE])

Extra-mesorectal Lymphadenopathy: Low superior rectal lymph node
(image [DATE] 6 mm. No pelvic sidewall lymphadenopathy

Other: Urinary bladder with smooth contours. No bladder wall
thickening or perivesical stranding.
IMPRESSION: 1. Slight increase in rectal thickening and distortion of the RIGHT
rectal wall at the site of previous primary tumor suspicious for
some disease worsening at this location. T3 B by imaging currently.
Correlation with direct visualization may be helpful if not recently
performed. (Remains improved when compared to the initial imaging
study from Friday September, 2019 but is potentially slightly increased
compared to the study Saturday September, 2020).
2. N2 disease with superior rectal and mesorectal lymph nodes which
are similar to the prior study.
3. No pelvic sidewall lymphadenopathy.

## 2022-12-29 DIAGNOSIS — R0683 Snoring: Secondary | ICD-10-CM | POA: Diagnosis not present

## 2022-12-29 DIAGNOSIS — M722 Plantar fascial fibromatosis: Secondary | ICD-10-CM | POA: Diagnosis not present

## 2022-12-29 DIAGNOSIS — R5383 Other fatigue: Secondary | ICD-10-CM | POA: Diagnosis not present

## 2023-01-05 DIAGNOSIS — M9903 Segmental and somatic dysfunction of lumbar region: Secondary | ICD-10-CM | POA: Diagnosis not present

## 2023-01-05 DIAGNOSIS — M9902 Segmental and somatic dysfunction of thoracic region: Secondary | ICD-10-CM | POA: Diagnosis not present

## 2023-01-05 DIAGNOSIS — M5442 Lumbago with sciatica, left side: Secondary | ICD-10-CM | POA: Diagnosis not present

## 2023-01-05 DIAGNOSIS — M9905 Segmental and somatic dysfunction of pelvic region: Secondary | ICD-10-CM | POA: Diagnosis not present

## 2023-01-06 DIAGNOSIS — G4710 Hypersomnia, unspecified: Secondary | ICD-10-CM | POA: Diagnosis not present

## 2023-01-07 DIAGNOSIS — M5442 Lumbago with sciatica, left side: Secondary | ICD-10-CM | POA: Diagnosis not present

## 2023-01-07 DIAGNOSIS — M9902 Segmental and somatic dysfunction of thoracic region: Secondary | ICD-10-CM | POA: Diagnosis not present

## 2023-01-07 DIAGNOSIS — M9903 Segmental and somatic dysfunction of lumbar region: Secondary | ICD-10-CM | POA: Diagnosis not present

## 2023-01-07 DIAGNOSIS — M9905 Segmental and somatic dysfunction of pelvic region: Secondary | ICD-10-CM | POA: Diagnosis not present

## 2023-01-07 DIAGNOSIS — G4710 Hypersomnia, unspecified: Secondary | ICD-10-CM | POA: Diagnosis not present

## 2023-01-18 DIAGNOSIS — G4733 Obstructive sleep apnea (adult) (pediatric): Secondary | ICD-10-CM | POA: Diagnosis not present

## 2023-01-24 DIAGNOSIS — G4733 Obstructive sleep apnea (adult) (pediatric): Secondary | ICD-10-CM | POA: Diagnosis not present

## 2023-01-26 DIAGNOSIS — D229 Melanocytic nevi, unspecified: Secondary | ICD-10-CM | POA: Diagnosis not present

## 2023-01-26 DIAGNOSIS — D2272 Melanocytic nevi of left lower limb, including hip: Secondary | ICD-10-CM | POA: Diagnosis not present

## 2023-01-26 DIAGNOSIS — D2262 Melanocytic nevi of left upper limb, including shoulder: Secondary | ICD-10-CM | POA: Diagnosis not present

## 2023-01-26 DIAGNOSIS — D2261 Melanocytic nevi of right upper limb, including shoulder: Secondary | ICD-10-CM | POA: Diagnosis not present

## 2023-02-09 DIAGNOSIS — M542 Cervicalgia: Secondary | ICD-10-CM | POA: Diagnosis not present

## 2023-02-09 DIAGNOSIS — M9902 Segmental and somatic dysfunction of thoracic region: Secondary | ICD-10-CM | POA: Diagnosis not present

## 2023-02-09 DIAGNOSIS — M546 Pain in thoracic spine: Secondary | ICD-10-CM | POA: Diagnosis not present

## 2023-02-09 DIAGNOSIS — M9901 Segmental and somatic dysfunction of cervical region: Secondary | ICD-10-CM | POA: Diagnosis not present

## 2023-02-11 DIAGNOSIS — M542 Cervicalgia: Secondary | ICD-10-CM | POA: Diagnosis not present

## 2023-02-11 DIAGNOSIS — M9902 Segmental and somatic dysfunction of thoracic region: Secondary | ICD-10-CM | POA: Diagnosis not present

## 2023-02-11 DIAGNOSIS — M9901 Segmental and somatic dysfunction of cervical region: Secondary | ICD-10-CM | POA: Diagnosis not present

## 2023-02-11 DIAGNOSIS — M546 Pain in thoracic spine: Secondary | ICD-10-CM | POA: Diagnosis not present

## 2023-02-24 DIAGNOSIS — G4733 Obstructive sleep apnea (adult) (pediatric): Secondary | ICD-10-CM | POA: Diagnosis not present

## 2023-03-07 DIAGNOSIS — G4733 Obstructive sleep apnea (adult) (pediatric): Secondary | ICD-10-CM | POA: Diagnosis not present

## 2023-03-26 DIAGNOSIS — S46911A Strain of unspecified muscle, fascia and tendon at shoulder and upper arm level, right arm, initial encounter: Secondary | ICD-10-CM | POA: Diagnosis not present

## 2023-03-26 DIAGNOSIS — E663 Overweight: Secondary | ICD-10-CM | POA: Diagnosis not present

## 2023-03-26 DIAGNOSIS — Z6828 Body mass index (BMI) 28.0-28.9, adult: Secondary | ICD-10-CM | POA: Diagnosis not present

## 2023-03-26 DIAGNOSIS — R030 Elevated blood-pressure reading, without diagnosis of hypertension: Secondary | ICD-10-CM | POA: Diagnosis not present

## 2023-03-27 DIAGNOSIS — G4733 Obstructive sleep apnea (adult) (pediatric): Secondary | ICD-10-CM | POA: Diagnosis not present

## 2023-03-28 DIAGNOSIS — M542 Cervicalgia: Secondary | ICD-10-CM | POA: Diagnosis not present

## 2023-03-28 DIAGNOSIS — M9902 Segmental and somatic dysfunction of thoracic region: Secondary | ICD-10-CM | POA: Diagnosis not present

## 2023-03-28 DIAGNOSIS — M9901 Segmental and somatic dysfunction of cervical region: Secondary | ICD-10-CM | POA: Diagnosis not present

## 2023-03-28 DIAGNOSIS — M546 Pain in thoracic spine: Secondary | ICD-10-CM | POA: Diagnosis not present

## 2023-03-31 DIAGNOSIS — K648 Other hemorrhoids: Secondary | ICD-10-CM | POA: Diagnosis not present

## 2023-03-31 DIAGNOSIS — Z980 Intestinal bypass and anastomosis status: Secondary | ICD-10-CM | POA: Diagnosis not present

## 2023-03-31 DIAGNOSIS — Z1211 Encounter for screening for malignant neoplasm of colon: Secondary | ICD-10-CM | POA: Diagnosis not present

## 2023-03-31 DIAGNOSIS — Z85038 Personal history of other malignant neoplasm of large intestine: Secondary | ICD-10-CM | POA: Diagnosis not present

## 2023-04-04 DIAGNOSIS — G4733 Obstructive sleep apnea (adult) (pediatric): Secondary | ICD-10-CM | POA: Diagnosis not present

## 2023-04-11 DIAGNOSIS — M542 Cervicalgia: Secondary | ICD-10-CM | POA: Diagnosis not present

## 2023-04-11 DIAGNOSIS — M9901 Segmental and somatic dysfunction of cervical region: Secondary | ICD-10-CM | POA: Diagnosis not present

## 2023-04-11 DIAGNOSIS — M9902 Segmental and somatic dysfunction of thoracic region: Secondary | ICD-10-CM | POA: Diagnosis not present

## 2023-04-11 DIAGNOSIS — M546 Pain in thoracic spine: Secondary | ICD-10-CM | POA: Diagnosis not present

## 2023-05-09 DIAGNOSIS — G4733 Obstructive sleep apnea (adult) (pediatric): Secondary | ICD-10-CM | POA: Diagnosis not present

## 2023-05-16 DIAGNOSIS — K080 Exfoliation of teeth due to systemic causes: Secondary | ICD-10-CM | POA: Diagnosis not present

## 2023-05-20 DIAGNOSIS — C7A021 Malignant carcinoid tumor of the cecum: Secondary | ICD-10-CM | POA: Diagnosis not present

## 2023-05-20 DIAGNOSIS — C49A5 Gastrointestinal stromal tumor of rectum: Secondary | ICD-10-CM | POA: Diagnosis not present

## 2023-05-24 DIAGNOSIS — G4733 Obstructive sleep apnea (adult) (pediatric): Secondary | ICD-10-CM | POA: Diagnosis not present

## 2023-06-04 DIAGNOSIS — E663 Overweight: Secondary | ICD-10-CM | POA: Diagnosis not present

## 2023-06-04 DIAGNOSIS — N3001 Acute cystitis with hematuria: Secondary | ICD-10-CM | POA: Diagnosis not present

## 2023-06-04 DIAGNOSIS — N390 Urinary tract infection, site not specified: Secondary | ICD-10-CM | POA: Diagnosis not present

## 2023-06-04 DIAGNOSIS — Z6829 Body mass index (BMI) 29.0-29.9, adult: Secondary | ICD-10-CM | POA: Diagnosis not present

## 2023-06-06 DIAGNOSIS — M160 Bilateral primary osteoarthritis of hip: Secondary | ICD-10-CM | POA: Diagnosis not present

## 2023-06-06 DIAGNOSIS — K5730 Diverticulosis of large intestine without perforation or abscess without bleeding: Secondary | ICD-10-CM | POA: Diagnosis not present

## 2023-06-06 DIAGNOSIS — C20 Malignant neoplasm of rectum: Secondary | ICD-10-CM | POA: Diagnosis not present

## 2023-06-08 ENCOUNTER — Other Ambulatory Visit (HOSPITAL_COMMUNITY): Payer: Self-pay | Admitting: Internal Medicine

## 2023-06-08 DIAGNOSIS — M810 Age-related osteoporosis without current pathological fracture: Secondary | ICD-10-CM

## 2023-06-09 DIAGNOSIS — C20 Malignant neoplasm of rectum: Secondary | ICD-10-CM | POA: Diagnosis not present

## 2023-06-15 ENCOUNTER — Other Ambulatory Visit (HOSPITAL_COMMUNITY): Payer: Medicare Other

## 2023-06-20 ENCOUNTER — Ambulatory Visit (HOSPITAL_COMMUNITY)
Admission: RE | Admit: 2023-06-20 | Discharge: 2023-06-20 | Disposition: A | Discharge status: Home / Self Care | Payer: Medicare Other | Source: Ambulatory Visit | Attending: Internal Medicine | Admitting: Internal Medicine

## 2023-06-20 DIAGNOSIS — M810 Age-related osteoporosis without current pathological fracture: Secondary | ICD-10-CM | POA: Diagnosis not present

## 2023-06-27 DIAGNOSIS — G4733 Obstructive sleep apnea (adult) (pediatric): Secondary | ICD-10-CM | POA: Diagnosis not present

## 2023-06-28 DIAGNOSIS — C20 Malignant neoplasm of rectum: Secondary | ICD-10-CM | POA: Diagnosis not present

## 2023-07-01 DIAGNOSIS — N3000 Acute cystitis without hematuria: Secondary | ICD-10-CM | POA: Diagnosis not present

## 2023-07-01 DIAGNOSIS — R300 Dysuria: Secondary | ICD-10-CM | POA: Diagnosis not present

## 2023-07-28 DIAGNOSIS — E782 Mixed hyperlipidemia: Secondary | ICD-10-CM | POA: Diagnosis not present

## 2023-07-28 DIAGNOSIS — C20 Malignant neoplasm of rectum: Secondary | ICD-10-CM | POA: Diagnosis not present

## 2023-07-28 DIAGNOSIS — L719 Rosacea, unspecified: Secondary | ICD-10-CM | POA: Diagnosis not present

## 2023-07-28 DIAGNOSIS — I872 Venous insufficiency (chronic) (peripheral): Secondary | ICD-10-CM | POA: Diagnosis not present

## 2023-07-28 DIAGNOSIS — M810 Age-related osteoporosis without current pathological fracture: Secondary | ICD-10-CM | POA: Diagnosis not present

## 2023-07-28 DIAGNOSIS — Z0001 Encounter for general adult medical examination with abnormal findings: Secondary | ICD-10-CM | POA: Diagnosis not present

## 2023-08-01 DIAGNOSIS — R300 Dysuria: Secondary | ICD-10-CM | POA: Diagnosis not present

## 2023-08-04 DIAGNOSIS — K080 Exfoliation of teeth due to systemic causes: Secondary | ICD-10-CM | POA: Diagnosis not present

## 2023-08-31 DIAGNOSIS — K080 Exfoliation of teeth due to systemic causes: Secondary | ICD-10-CM | POA: Diagnosis not present

## 2023-09-29 ENCOUNTER — Other Ambulatory Visit (HOSPITAL_COMMUNITY): Payer: Self-pay | Admitting: Nurse Practitioner

## 2023-09-29 ENCOUNTER — Encounter (HOSPITAL_COMMUNITY): Payer: Self-pay | Admitting: Nurse Practitioner

## 2023-09-29 DIAGNOSIS — R1011 Right upper quadrant pain: Secondary | ICD-10-CM | POA: Diagnosis not present

## 2023-09-29 DIAGNOSIS — R112 Nausea with vomiting, unspecified: Secondary | ICD-10-CM

## 2023-09-30 ENCOUNTER — Other Ambulatory Visit: Payer: Self-pay

## 2023-09-30 ENCOUNTER — Encounter (HOSPITAL_COMMUNITY): Payer: Self-pay | Admitting: *Deleted

## 2023-09-30 ENCOUNTER — Emergency Department (HOSPITAL_COMMUNITY)

## 2023-09-30 ENCOUNTER — Emergency Department (HOSPITAL_COMMUNITY)
Admission: EM | Admit: 2023-09-30 | Discharge: 2023-09-30 | Disposition: A | Discharge status: Home / Self Care | Attending: Emergency Medicine | Admitting: Emergency Medicine

## 2023-09-30 ENCOUNTER — Ambulatory Visit (HOSPITAL_COMMUNITY)
Admission: RE | Admit: 2023-09-30 | Discharge: 2023-09-30 | Disposition: A | Discharge status: Home / Self Care | Source: Ambulatory Visit | Attending: Nurse Practitioner | Admitting: Nurse Practitioner

## 2023-09-30 DIAGNOSIS — M549 Dorsalgia, unspecified: Secondary | ICD-10-CM | POA: Diagnosis not present

## 2023-09-30 DIAGNOSIS — R112 Nausea with vomiting, unspecified: Secondary | ICD-10-CM | POA: Diagnosis not present

## 2023-09-30 DIAGNOSIS — R9431 Abnormal electrocardiogram [ECG] [EKG]: Secondary | ICD-10-CM | POA: Diagnosis not present

## 2023-09-30 DIAGNOSIS — M546 Pain in thoracic spine: Secondary | ICD-10-CM | POA: Diagnosis not present

## 2023-09-30 DIAGNOSIS — G8929 Other chronic pain: Secondary | ICD-10-CM

## 2023-09-30 DIAGNOSIS — M545 Low back pain, unspecified: Secondary | ICD-10-CM | POA: Insufficient documentation

## 2023-09-30 LAB — TROPONIN I (HIGH SENSITIVITY)
Troponin I (High Sensitivity): 3 ng/L (ref <18)
Troponin I (High Sensitivity): 3 ng/L (ref <18)

## 2023-09-30 LAB — CBC WITH DIFFERENTIAL/PLATELET
Abs Immature Granulocytes: 0.01 10*3/uL (ref 0.00–0.07)
Basophils Absolute: 0 10*3/uL (ref 0.0–0.1)
Basophils Relative: 0 %
Eosinophils Absolute: 0.1 10*3/uL (ref 0.0–0.5)
Eosinophils Relative: 1 %
HCT: 43.4 % (ref 36.0–46.0)
Hemoglobin: 13.9 g/dL (ref 12.0–15.0)
Immature Granulocytes: 0 %
Lymphocytes Relative: 39 %
Lymphs Abs: 2.5 10*3/uL (ref 0.7–4.0)
MCH: 31 pg (ref 26.0–34.0)
MCHC: 32 g/dL (ref 30.0–36.0)
MCV: 96.9 fL (ref 80.0–100.0)
Monocytes Absolute: 0.6 10*3/uL (ref 0.1–1.0)
Monocytes Relative: 9 %
Neutro Abs: 3.2 10*3/uL (ref 1.7–7.7)
Neutrophils Relative %: 51 %
Platelets: 340 10*3/uL (ref 150–400)
RBC: 4.48 MIL/uL (ref 3.87–5.11)
RDW: 12.9 % (ref 11.5–15.5)
WBC: 6.4 10*3/uL (ref 4.0–10.5)
nRBC: 0 % (ref 0.0–0.2)

## 2023-09-30 LAB — BASIC METABOLIC PANEL WITH GFR
Anion gap: 9 (ref 5–15)
BUN: 11 mg/dL (ref 8–23)
CO2: 22 mmol/L (ref 22–32)
Calcium: 9.8 mg/dL (ref 8.9–10.3)
Chloride: 103 mmol/L (ref 98–111)
Creatinine, Ser: 0.51 mg/dL (ref 0.44–1.00)
GFR, Estimated: >60 mL/min (ref >60)
Glucose, Bld: 91 mg/dL (ref 70–99)
Potassium: 4.2 mmol/L (ref 3.5–5.1)
Sodium: 134 mmol/L — ABNORMAL LOW (ref 135–145)

## 2023-09-30 LAB — D-DIMER, QUANTITATIVE: D-Dimer, Quant: <0.27 ug{FEU}/mL (ref 0.00–0.50)

## 2023-09-30 NOTE — Discharge Instructions (Addendum)
 Workup here today is reassuring.  Your x-ray of your chest does show mild compression of thoracic vertebrae.  This can be further evaluated with MRI and I would recommend discussing with PCP.  If your pain returns I recommend taking anti-inflammatories like ibuprofen or naproxen.  You can also take 1000 mg of Tylenol every 6 hours. You can also use ice and heat.  Please follow-up with primary care doctor for further evaluation of back pain.  Return to ER with any new or worsening symptoms.

## 2023-09-30 NOTE — ED Triage Notes (Signed)
 Pt with upper back pain, heaviness. Pt states started about an hour ago.  Recent travel with ?food poisoning and not feeling normal since back.  +SOB + nausea

## 2023-09-30 NOTE — ED Provider Notes (Signed)
 [place] EMERGENCY DEPARTMENT AT [facility] Provider Note   CSN: [id number] Arrival date & time: 09/30/23  1446     History  Chief Complaint  Patient presents with   Back Pain    Kathy Zuniga is a 68 y.o. female with past medical history of GERD, osteoporosis, chronic back pain, history of rectal cancer presenting to emergency room with complaint of back pain.  Patient reports that this morning she stated experiencing upper left sided back pain associated with a "heavy feeling" and shortness of breath.  His recent 14-hour car ride.  Reports 1 week ago she had food poisoning with symptoms of nausea vomiting diarrhea that resolved on its own and has not been an issue since.  Reports she is normally very active and able to garden around the house.  Denies any chest pain, palpitations abdominal pain or current nausea vomiting diarrhea. No recent antibiotic use. No prior history of DVT/PE. Not on BT.    Back Pain      Home Medications Prior to Admission medications   Medication Sig Start Date End Date Taking? Authorizing Provider  amoxicillin (AMOXIL) 500 MG capsule Take 500 mg by mouth 3 (three) times daily. 08/31/23  Yes [provider]  Ascorbic Acid (VITAMIN C) 1000 MG tablet Take 1,000 mg by mouth See admin instructions. Take 1 tablet (1000 mg) by mouth up to 2 times daily    [provider]  BLACK CURRANT SEED OIL PO Take 1,250 mg by mouth daily in the afternoon.    [provider]  Cholecalciferol (VITAMIN D-3) 125 MCG (5000 UT) TABS Take 5,000 Units by mouth in the morning.    [provider]  LORazepam (ATIVAN) 1 MG tablet Take 1 tablet 30 minutes prior to leaving house on day of office surgery.  Bring second tablet with you to office on day of office surgery. Patient not taking: Reported on 04/16/2022 03/23/22   Maeola Harman, MD  MAGNESIUM MALATE PO Take 950 mg by mouth at bedtime.    [provider]   Menaquinone-7 (VITAMIN K2 PO) Take 150 mcg by mouth in the morning.    [provider]  TURMERIC PO Take 1,000 mg by mouth in the morning.    [provider]      Allergies    Almond meal (obsolete), Asa [aspirin], and Ibuprofen    Review of Systems   Review of Systems  Musculoskeletal:  Positive for back pain.    Physical Exam Updated Vital Signs BP (!) 122/58   Pulse 79   Temp 99 F (37.2 C) (Oral)   Resp 18   Ht 5\' 4"  (1.626 m)   Wt 68.9 kg   SpO2 99%   BMI 26.09 kg/m  Physical Exam Vitals and nursing note reviewed.  Constitutional:      General: She is not in acute distress.    Appearance: She is not toxic-appearing.  HENT:     Head: Normocephalic and atraumatic.  Eyes:     General: No scleral icterus.    Conjunctiva/sclera: Conjunctivae normal.  Cardiovascular:     Rate and Rhythm: Normal rate and regular rhythm.     Pulses: Normal pulses.     Heart sounds: Normal heart sounds.  Pulmonary:     Effort: Pulmonary effort is normal. No respiratory distress.     Breath sounds: Normal breath sounds.  Abdominal:     General: Abdomen is flat. Bowel sounds are normal.  Palpations: Abdomen is soft.     Tenderness: There is no abdominal tenderness.  Musculoskeletal:        General: Tenderness present.     Right lower leg: No edema.     Left lower leg: No edema.     Comments: To left upper shoulder area and left upper thoracic paraspinal musculature.  No midline tenderness of the vertebrae.  Skin:    General: Skin is warm and dry.     Findings: No lesion.  Neurological:     General: No focal deficit present.     Mental Status: She is alert and oriented to person, place, and time. Mental status is at baseline.     Comments: Upper and lower extremity sensation intact, strength intact. Strong pulse bilaterally.      ED Results / Procedures / Treatments   Labs (all labs ordered are listed, but only abnormal results are displayed) Labs  Reviewed  BASIC METABOLIC PANEL WITH GFR - Abnormal; Notable for the following components:      Result Value   Sodium 134 (*)    All other components within normal limits  CBC WITH DIFFERENTIAL/PLATELET  D-DIMER, QUANTITATIVE  TROPONIN I (HIGH SENSITIVITY)  TROPONIN I (HIGH SENSITIVITY)    EKG EKG Interpretation Date/Time:  Friday September 30 2023 14:54:32 EDT Ventricular Rate:  76 PR Interval:  200 QRS Duration:  68 QT Interval:  368 QTC Calculation: 414 R Axis:   -25  Text Interpretation: Normal sinus rhythm Septal infarct (cited on or before 03-Feb-2016) Abnormal ECG When compared with ECG of 03-Feb-2016 00:11, QT has shortened Confirmed by Vonita Moss [phone]) on 09/30/2023 4:51:00 PM  Radiology DG Chest 2 View Result Date: 09/30/2023 CLINICAL DATA:  Upper back pain for 1 week. EXAM: CHEST - 2 VIEW COMPARISON:  September 07, 2016. FINDINGS: The heart size and mediastinal contours are within normal limits. Both lungs are clear. Minimal compression deformity of midthoracic vertebral body is noted concerning for fracture of indeterminate age. IMPRESSION: No active cardiopulmonary disease. Minimal compression deformity of midthoracic vertebral body is noted concerning for fracture of indeterminate age. MRI may be performed for further evaluation. Electronically Signed   By: Lupita Raider M.D.   On: 09/30/2023 15:37   US Abdomen Complete Result Date: 09/30/2023 CLINICAL DATA:  Chronic nausea and vomiting. EXAM: ABDOMEN ULTRASOUND COMPLETE COMPARISON:  08/21/2017. FINDINGS: Gallbladder: No gallstones or wall thickening visualized. No sonographic Murphy sign noted by sonographer. Common bile duct: Diameter: 3 mm which is within normal limits. Liver: No focal lesion identified. Mildly increased echogenicity of hepatic parenchyma is noted suggesting hepatic steatosis. Portal vein is patent on color Doppler imaging with normal direction of blood flow towards the liver. IVC: No abnormality  visualized. Pancreas: Visualized portion unremarkable. Spleen: Size and appearance within normal limits. Right Kidney: Length: 10.6 cm. Echogenicity within normal limits. No mass or hydronephrosis visualized. Left Kidney: Length: 12.2 cm. Echogenicity within normal limits. No mass or hydronephrosis visualized. Abdominal aorta: No aneurysm visualized. Other findings: None. IMPRESSION: Mild increased echogenicity of hepatic parenchyma is noted suggesting hepatic steatosis. No other abnormality seen in the abdomen. Electronically Signed   By: Lupita Raider M.D.   On: 09/30/2023 08:38    Procedures Procedures    Medications Ordered in ED Medications - No data to display  ED Course/ Medical Decision Making/ A&P Clinical Course as of 10/01/23 1325  Fri Sep 30, 2023  1935 Patient reports she is feeling better, dose not want any further  testing here today. Would like to follow up with PCP. Rates pain 0/10.  [JB]    Clinical Course User Index [JB] Miki Labuda, Horald Chestnut, PA-C                                 Medical Decision Making Amount and/or Complexity of Data Reviewed Labs: ordered. Radiology: ordered.   This patient presents to the ED for concern of back pain, this involves an extensive number of treatment options, and is a complaint that carries with it a high risk of complications and morbidity.  The differential diagnosis includes   Co morbidities that complicate the patient evaluation   GERD, osteoporosis, chronic back pain, chronic midline thoracic tenderness, rectal cancer, Jehovah's Witness   Additional history obtained:  Additional history obtained from her x-ray from 2018 which shows mild compression of T6 vertebral body.  Patient has history of chronic midline tenderness secondary to this fracture suspect that the x-ray finding is chronic.   Lab Tests:  I personally interpreted labs.  The pertinent results include:   CBC without leukocytosis and no anemia.  BMP significant  for mild hyponatremia 134.  Potassium is normal GFR is normal. D-dimer negative, Trop and delta Trop negative   Imaging Studies ordered:  I ordered imaging studies including chest x-ray   I independently visualized and interpreted imaging which showed no active cardiopulmonary disease, minimal compression deformity of the mid thoracic vertebral body. I agree with the radiologist interpretation   Cardiac Monitoring: / EKG:  The patient was maintained on a cardiac monitor.  I personally viewed and interpreted the cardiac monitored which showed an underlying rhythm of: Normal sinus   Problem List / ED Course / Critical interventions / Medication management  Patient presenting to emergency room with midthoracic back pain.  She reports that the back pain came on it was so intense that she had difficulty taking deep breath in.  She denies any chest pain or radiating symptoms.  Given location will obtain imaging, EKG troponin basic labs and dimer.  She is in no acute distress.  She is hemodynamically stable.  Lungs clear to auscultation no focal area of abdominal tenderness.  She does have left sided mid upper back pain with no midline tenderness. Patient has had reassuring EKG, 2 negative troponins thus doubt ACS as cause of pain.  D-dimer negative thus doubt PE or DVT.  X-ray shows no pneumothorax or pneumonia.  Her x-ray does show compression fracture which compared to prior imaging I feel this is chronic.  I did discuss obtaining MRI for further evaluation in the outpatient setting.  She has no active cancer and is in remission.  No radicular symptoms.  No weakness.  Offered to obtain further imaging while in ER however patient family ember in room would like to be discharged.  Feel this is appropriate as I suspect this is not new fracture.  Offered medication and no pain medication was needed during stay.  Symptoms improved on own.  Follow-up precautions and return precautions.  Patient family  expressed understanding and agreed to plan.  I have reviewed the patients home medicines and have made adjustments as needed         Final Clinical Impression(s) / ED Diagnoses Final diagnoses:  Chronic bilateral thoracic back pain    Rx / DC Orders ED Discharge Orders     None         Mililani Murthy,  Horald Chestnut, PA-C 10/01/23 1330    Rondel Baton, MD 10/02/23 [phone]

## 2023-10-04 DIAGNOSIS — R112 Nausea with vomiting, unspecified: Secondary | ICD-10-CM | POA: Diagnosis not present

## 2023-10-04 DIAGNOSIS — M4854XD Collapsed vertebra, not elsewhere classified, thoracic region, subsequent encounter for fracture with routine healing: Secondary | ICD-10-CM | POA: Diagnosis not present

## 2023-10-04 DIAGNOSIS — R1011 Right upper quadrant pain: Secondary | ICD-10-CM | POA: Diagnosis not present

## 2023-10-04 DIAGNOSIS — Z91018 Allergy to other foods: Secondary | ICD-10-CM | POA: Diagnosis not present

## 2023-10-04 DIAGNOSIS — Z7689 Persons encountering health services in other specified circumstances: Secondary | ICD-10-CM | POA: Diagnosis not present

## 2023-10-11 ENCOUNTER — Other Ambulatory Visit (HOSPITAL_COMMUNITY): Payer: Self-pay | Admitting: Nurse Practitioner

## 2023-10-11 DIAGNOSIS — M4854XD Collapsed vertebra, not elsewhere classified, thoracic region, subsequent encounter for fracture with routine healing: Secondary | ICD-10-CM

## 2023-10-17 ENCOUNTER — Encounter (HOSPITAL_COMMUNITY): Payer: Self-pay

## 2023-10-17 ENCOUNTER — Ambulatory Visit (HOSPITAL_COMMUNITY)

## 2023-10-17 DIAGNOSIS — M9902 Segmental and somatic dysfunction of thoracic region: Secondary | ICD-10-CM | POA: Diagnosis not present

## 2023-10-17 DIAGNOSIS — M9901 Segmental and somatic dysfunction of cervical region: Secondary | ICD-10-CM | POA: Diagnosis not present

## 2023-10-17 DIAGNOSIS — M546 Pain in thoracic spine: Secondary | ICD-10-CM | POA: Diagnosis not present

## 2023-10-17 DIAGNOSIS — M542 Cervicalgia: Secondary | ICD-10-CM | POA: Diagnosis not present

## 2023-10-20 DIAGNOSIS — E210 Primary hyperparathyroidism: Secondary | ICD-10-CM | POA: Diagnosis not present

## 2023-10-20 DIAGNOSIS — R918 Other nonspecific abnormal finding of lung field: Secondary | ICD-10-CM | POA: Diagnosis not present

## 2023-10-20 DIAGNOSIS — E042 Nontoxic multinodular goiter: Secondary | ICD-10-CM | POA: Diagnosis not present

## 2023-10-21 DIAGNOSIS — M542 Cervicalgia: Secondary | ICD-10-CM | POA: Diagnosis not present

## 2023-10-21 DIAGNOSIS — M546 Pain in thoracic spine: Secondary | ICD-10-CM | POA: Diagnosis not present

## 2023-10-21 DIAGNOSIS — M9901 Segmental and somatic dysfunction of cervical region: Secondary | ICD-10-CM | POA: Diagnosis not present

## 2023-10-21 DIAGNOSIS — M9902 Segmental and somatic dysfunction of thoracic region: Secondary | ICD-10-CM | POA: Diagnosis not present

## 2023-10-25 DIAGNOSIS — R918 Other nonspecific abnormal finding of lung field: Secondary | ICD-10-CM | POA: Diagnosis not present

## 2023-10-25 DIAGNOSIS — E210 Primary hyperparathyroidism: Secondary | ICD-10-CM | POA: Diagnosis not present

## 2023-10-26 DIAGNOSIS — M9902 Segmental and somatic dysfunction of thoracic region: Secondary | ICD-10-CM | POA: Diagnosis not present

## 2023-10-26 DIAGNOSIS — M542 Cervicalgia: Secondary | ICD-10-CM | POA: Diagnosis not present

## 2023-10-26 DIAGNOSIS — M546 Pain in thoracic spine: Secondary | ICD-10-CM | POA: Diagnosis not present

## 2023-10-26 DIAGNOSIS — M9901 Segmental and somatic dysfunction of cervical region: Secondary | ICD-10-CM | POA: Diagnosis not present

## 2023-11-03 ENCOUNTER — Ambulatory Visit (HOSPITAL_COMMUNITY)
Admission: RE | Admit: 2023-11-03 | Discharge: 2023-11-03 | Disposition: A | Discharge status: Home / Self Care | Source: Ambulatory Visit | Attending: Nurse Practitioner | Admitting: Nurse Practitioner

## 2023-11-03 DIAGNOSIS — M4854XD Collapsed vertebra, not elsewhere classified, thoracic region, subsequent encounter for fracture with routine healing: Secondary | ICD-10-CM | POA: Insufficient documentation

## 2023-11-03 DIAGNOSIS — M40209 Unspecified kyphosis, site unspecified: Secondary | ICD-10-CM | POA: Diagnosis not present

## 2023-11-03 DIAGNOSIS — M438X4 Other specified deforming dorsopathies, thoracic region: Secondary | ICD-10-CM | POA: Diagnosis not present

## 2023-11-10 DIAGNOSIS — G4733 Obstructive sleep apnea (adult) (pediatric): Secondary | ICD-10-CM | POA: Diagnosis not present

## 2023-11-22 DIAGNOSIS — M546 Pain in thoracic spine: Secondary | ICD-10-CM | POA: Diagnosis not present

## 2023-11-22 DIAGNOSIS — M542 Cervicalgia: Secondary | ICD-10-CM | POA: Diagnosis not present

## 2023-11-22 DIAGNOSIS — M9902 Segmental and somatic dysfunction of thoracic region: Secondary | ICD-10-CM | POA: Diagnosis not present

## 2023-11-22 DIAGNOSIS — M9901 Segmental and somatic dysfunction of cervical region: Secondary | ICD-10-CM | POA: Diagnosis not present

## 2023-11-23 DIAGNOSIS — L718 Other rosacea: Secondary | ICD-10-CM | POA: Diagnosis not present

## 2023-12-07 DIAGNOSIS — M9901 Segmental and somatic dysfunction of cervical region: Secondary | ICD-10-CM | POA: Diagnosis not present

## 2023-12-07 DIAGNOSIS — M546 Pain in thoracic spine: Secondary | ICD-10-CM | POA: Diagnosis not present

## 2023-12-07 DIAGNOSIS — M542 Cervicalgia: Secondary | ICD-10-CM | POA: Diagnosis not present

## 2023-12-07 DIAGNOSIS — M9902 Segmental and somatic dysfunction of thoracic region: Secondary | ICD-10-CM | POA: Diagnosis not present

## 2023-12-27 DIAGNOSIS — E210 Primary hyperparathyroidism: Secondary | ICD-10-CM | POA: Diagnosis not present

## 2024-01-19 DIAGNOSIS — K080 Exfoliation of teeth due to systemic causes: Secondary | ICD-10-CM | POA: Diagnosis not present

## 2024-02-03 DIAGNOSIS — M79672 Pain in left foot: Secondary | ICD-10-CM | POA: Diagnosis not present

## 2024-02-03 DIAGNOSIS — M79671 Pain in right foot: Secondary | ICD-10-CM | POA: Diagnosis not present

## 2024-02-13 ENCOUNTER — Ambulatory Visit (INDEPENDENT_AMBULATORY_CARE_PROVIDER_SITE_OTHER)

## 2024-02-13 ENCOUNTER — Ambulatory Visit (INDEPENDENT_AMBULATORY_CARE_PROVIDER_SITE_OTHER): Admitting: Podiatry

## 2024-02-13 ENCOUNTER — Encounter: Payer: Self-pay | Admitting: Podiatry

## 2024-02-13 VITALS — Ht 64.0 in | Wt 152.0 lb

## 2024-02-13 DIAGNOSIS — M7662 Achilles tendinitis, left leg: Secondary | ICD-10-CM

## 2024-02-13 DIAGNOSIS — M722 Plantar fascial fibromatosis: Secondary | ICD-10-CM | POA: Diagnosis not present

## 2024-02-13 DIAGNOSIS — M7752 Other enthesopathy of left foot: Secondary | ICD-10-CM

## 2024-02-13 DIAGNOSIS — M7751 Other enthesopathy of right foot: Secondary | ICD-10-CM

## 2024-02-13 MED ORDER — BETAMETHASONE SOD PHOS & ACET 6 (3-3) MG/ML IJ SUSP
3.0000 mg | Freq: Once | INTRAMUSCULAR | Status: AC
  Administered 2024-02-13: 3 mg via INTRA_ARTICULAR

## 2024-02-13 NOTE — Progress Notes (Signed)
 Chief Complaint  Patient presents with   Foot Pain    Pt is here due to bilateral foot and left ankle pain, states the pain in her feet has been going on for a few months states no injury, left ankle has been hurting for a month hurt herself while digging a whole in her garden.    HPI: 68 y.o. female presenting today for evaluation of right heel pain ongoing for few months now.  Idiopathic gradual onset.  She has not anything for treatment.  Also states that about 2 weeks ago she was digging a hole in her garden and developed some left posterior ankle pain.  Past Medical History:  Diagnosis Date   Arthritis    Back pain    Fracture of coccyx (HCC)    Frequent headaches    Migraines    No blood products 02/15/2017   Jehovah's Witness   No blood products    Jehovah Witness   Osteoporosis    Thyroid  disease     Past Surgical History:  Procedure Laterality Date   BIOPSY  05/13/2021   Procedure: BIOPSY;  Surgeon: Eartha Angelia Sieving, MD;  Location: [facility];  Service: Gastroenterology;;   ENDOVENOUS ABLATION SAPHENOUS VEIN W/ LASER Right 04/01/2022   endovenous laser ablation right greater saphenous vein and stab phlebectomy 10-20 incisions right leg by Penne Colorado MD   FLEXIBLE SIGMOIDOSCOPY N/A 05/13/2021   Procedure: ENID MORIN;  Surgeon: Eartha Angelia Sieving, MD;  Location: [facility];  Service: Gastroenterology;  Laterality: N/A;  1:45   WISDOM TOOTH EXTRACTION      Allergies  Allergen Reactions   Almond Meal (Obsolete) Other (See Comments)    Throat itching/irritation   Asa [Aspirin] Other (See Comments)    GI bleed     Ibuprofen  Other (See Comments)    GI bleed     Physical Exam: General: The patient is alert and oriented x3 in no acute distress.  Dermatology: Skin is warm, dry and supple bilateral lower extremities.   Vascular: Palpable pedal pulses bilaterally. Capillary refill within normal limits.  No appreciable edema.   No erythema.  Neurological: Grossly intact via light touch  Musculoskeletal Exam: No pedal deformities noted.  Tenderness with palpation to the plantar heel right foot consistent with plantar fasciitis.  There is also tenderness along the midportion of the Achilles tendon left lower extremity  Radiographic Exam B/L feet 02/13/2024:  Normal osseous mineralization. Joint spaces preserved.  No fractures or osseous irregularities noted.  Impression: Negative  Assessment/Plan of Care: 1.  Plantar fasciitis right 2.  Achilles tendinitis left  -Patient evaluated.  X-rays reviewed -Injection of 0.5 cc Celestone  Soluspan injected into the plantar fascia right -Declined prednisone pack or oral NSAIDs -Continue OTC Motrin  PRN -Today patient was molded for custom orthotics by our Pedorthist to support the medial longitudinal arch of the foot and potentially alleviate the pain and pressure from the plantar heel as well as the Achilles tendon left lower extremity -Return to clinic for orthotics dispensable       Thresa EMERSON Sar, DPM [facility]  Dr. Thresa EMERSON Sar, DPM    [address], [place] [address]  Office [phone]  Fax [phone]

## 2024-02-13 NOTE — Progress Notes (Signed)
  Orthotics   Patient was present and evaluated for Custom molded foot orthotics. Patient will benefit from CFO's to provide total contact to BIL MLA's helping to balance and distribute body weight more evenly across BIL feet helping to reduce plantar pressure and pain. Orthotic will also encourage FF / RF alignment  Patient was scanned today and will return for fitting upon receipt.   I will not place order until patient stops in to pay $150.00 deposit  [id number] not reviewable with [facility]

## 2024-02-23 DIAGNOSIS — K080 Exfoliation of teeth due to systemic causes: Secondary | ICD-10-CM | POA: Diagnosis not present

## 2024-02-24 ENCOUNTER — Telehealth: Payer: Self-pay

## 2024-02-24 NOTE — Telephone Encounter (Signed)
 Pt called. Pt states she has a 20% coins and would like to move forward in getting her orthotics. She said she needs prior auth . faxing today

## 2024-02-28 DIAGNOSIS — M9902 Segmental and somatic dysfunction of thoracic region: Secondary | ICD-10-CM | POA: Diagnosis not present

## 2024-02-28 DIAGNOSIS — M9903 Segmental and somatic dysfunction of lumbar region: Secondary | ICD-10-CM | POA: Diagnosis not present

## 2024-02-28 DIAGNOSIS — M9905 Segmental and somatic dysfunction of pelvic region: Secondary | ICD-10-CM | POA: Diagnosis not present

## 2024-02-28 DIAGNOSIS — M62830 Muscle spasm of back: Secondary | ICD-10-CM | POA: Diagnosis not present

## 2024-02-28 NOTE — Telephone Encounter (Signed)
 Auth rcvd and approved  Auth#  [id number]  02/24/24-05/25/24

## 2024-03-27 DIAGNOSIS — H5203 Hypermetropia, bilateral: Secondary | ICD-10-CM | POA: Diagnosis not present

## 2024-04-06 ENCOUNTER — Telehealth: Payer: Self-pay

## 2024-04-06 NOTE — Telephone Encounter (Signed)
 Patient called and would like to know the status of her orthotics order

## 2024-04-10 NOTE — Telephone Encounter (Signed)
 Items ordered rushed [facility] [id number]  Need to get delivered before auth expires  Lolita Schultze Cped

## 2024-04-26 ENCOUNTER — Other Ambulatory Visit (HOSPITAL_COMMUNITY): Payer: Self-pay | Admitting: Nurse Practitioner

## 2024-04-26 ENCOUNTER — Emergency Department (HOSPITAL_BASED_OUTPATIENT_CLINIC_OR_DEPARTMENT_OTHER)
Admission: EM | Admit: 2024-04-26 | Discharge: 2024-04-26 | Disposition: A | Discharge status: Home / Self Care | Attending: Emergency Medicine | Admitting: Emergency Medicine

## 2024-04-26 ENCOUNTER — Emergency Department (HOSPITAL_BASED_OUTPATIENT_CLINIC_OR_DEPARTMENT_OTHER)

## 2024-04-26 ENCOUNTER — Other Ambulatory Visit: Payer: Self-pay

## 2024-04-26 DIAGNOSIS — R109 Unspecified abdominal pain: Secondary | ICD-10-CM

## 2024-04-26 DIAGNOSIS — R1011 Right upper quadrant pain: Secondary | ICD-10-CM | POA: Diagnosis not present

## 2024-04-26 DIAGNOSIS — K760 Fatty (change of) liver, not elsewhere classified: Secondary | ICD-10-CM | POA: Diagnosis not present

## 2024-04-26 DIAGNOSIS — K429 Umbilical hernia without obstruction or gangrene: Secondary | ICD-10-CM | POA: Diagnosis not present

## 2024-04-26 DIAGNOSIS — R1013 Epigastric pain: Secondary | ICD-10-CM | POA: Insufficient documentation

## 2024-04-26 DIAGNOSIS — R110 Nausea: Secondary | ICD-10-CM | POA: Insufficient documentation

## 2024-04-26 DIAGNOSIS — N281 Cyst of kidney, acquired: Secondary | ICD-10-CM | POA: Diagnosis not present

## 2024-04-26 DIAGNOSIS — R1084 Generalized abdominal pain: Secondary | ICD-10-CM | POA: Diagnosis not present

## 2024-04-26 DIAGNOSIS — R1010 Upper abdominal pain, unspecified: Secondary | ICD-10-CM | POA: Diagnosis not present

## 2024-04-26 DIAGNOSIS — N3289 Other specified disorders of bladder: Secondary | ICD-10-CM | POA: Diagnosis not present

## 2024-04-26 LAB — COMPREHENSIVE METABOLIC PANEL WITH GFR
ALT: 23 U/L (ref 0–44)
AST: 25 U/L (ref 15–41)
Albumin: 4.5 g/dL (ref 3.5–5.0)
Alkaline Phosphatase: 95 U/L (ref 38–126)
Anion gap: 11 (ref 5–15)
BUN: 9 mg/dL (ref 8–23)
CO2: 24 mmol/L (ref 22–32)
Calcium: 10.7 mg/dL — ABNORMAL HIGH (ref 8.9–10.3)
Chloride: 105 mmol/L (ref 98–111)
Creatinine, Ser: 0.48 mg/dL (ref 0.44–1.00)
GFR, Estimated: >60 mL/min (ref >60)
Glucose, Bld: 86 mg/dL (ref 70–99)
Potassium: 3.8 mmol/L (ref 3.5–5.1)
Sodium: 139 mmol/L (ref 135–145)
Total Bilirubin: 0.6 mg/dL (ref 0.0–1.2)
Total Protein: 7.9 g/dL (ref 6.5–8.1)

## 2024-04-26 LAB — URINALYSIS, ROUTINE W REFLEX MICROSCOPIC
Bilirubin Urine: NEGATIVE
Glucose, UA: NEGATIVE mg/dL
Hgb urine dipstick: NEGATIVE
Ketones, ur: 15 mg/dL — AB
Leukocytes,Ua: NEGATIVE
Nitrite: NEGATIVE
Protein, ur: NEGATIVE mg/dL
Specific Gravity, Urine: 1.005 (ref 1.005–1.030)
pH: 6 (ref 5.0–8.0)

## 2024-04-26 LAB — CBC
HCT: 45.1 % (ref 36.0–46.0)
Hemoglobin: 14.8 g/dL (ref 12.0–15.0)
MCH: 31.8 pg (ref 26.0–34.0)
MCHC: 32.8 g/dL (ref 30.0–36.0)
MCV: 97 fL (ref 80.0–100.0)
Platelets: 292 K/uL (ref 150–400)
RBC: 4.65 MIL/uL (ref 3.87–5.11)
RDW: 13.6 % (ref 11.5–15.5)
WBC: 6.8 K/uL (ref 4.0–10.5)
nRBC: 0 % (ref 0.0–0.2)

## 2024-04-26 LAB — LACTIC ACID, PLASMA: Lactic Acid, Venous: 0.9 mmol/L (ref 0.5–1.9)

## 2024-04-26 LAB — LIPASE, BLOOD: Lipase: 24 U/L (ref 11–51)

## 2024-04-26 MED ORDER — LIDOCAINE VISCOUS HCL 2 % MT SOLN
15.0000 mL | Freq: Once | OROMUCOSAL | Status: AC
  Administered 2024-04-26: 15 mL via ORAL
  Filled 2024-04-26: qty 15

## 2024-04-26 MED ORDER — SUCRALFATE 1 G PO TABS: 1.0000 g | ORAL_TABLET | Freq: Three times a day (TID) | ORAL | 0 refills | Status: DC

## 2024-04-26 MED ORDER — IOHEXOL 300 MG/ML  SOLN
100.0000 mL | Freq: Once | INTRAMUSCULAR | Status: AC | PRN
  Administered 2024-04-26: 100 mL via INTRAVENOUS

## 2024-04-26 MED ORDER — SODIUM CHLORIDE 0.9 % IV BOLUS
1000.0000 mL | Freq: Once | INTRAVENOUS | Status: AC
  Administered 2024-04-26: 1000 mL via INTRAVENOUS

## 2024-04-26 MED ORDER — OMEPRAZOLE 20 MG PO CPDR: 20.0000 mg | DELAYED_RELEASE_CAPSULE | Freq: Every day | ORAL | 0 refills | Status: DC

## 2024-04-26 MED ORDER — ALUM & MAG HYDROXIDE-SIMETH 200-200-20 MG/5ML PO SUSP
30.0000 mL | Freq: Once | ORAL | Status: AC
  Administered 2024-04-26: 30 mL via ORAL
  Filled 2024-04-26: qty 30

## 2024-04-26 MED ORDER — ONDANSETRON HCL 4 MG/2ML IJ SOLN
4.0000 mg | Freq: Once | INTRAMUSCULAR | Status: AC
  Administered 2024-04-26: 4 mg via INTRAVENOUS
  Filled 2024-04-26: qty 2

## 2024-04-26 MED ORDER — MORPHINE SULFATE (PF) 4 MG/ML IV SOLN
4.0000 mg | Freq: Once | INTRAVENOUS | Status: AC
  Administered 2024-04-26: 4 mg via INTRAVENOUS
  Filled 2024-04-26: qty 1

## 2024-04-26 NOTE — Discharge Instructions (Signed)
 Your history, exam, and evaluation today did not reveal an acute surgical or emergent problem requiring admission or intervention at this time.  I suspect you have gastritis or irritation in your upper GI tract contributing to your discomfort and the medicines did not help you today.  We do recommend follow-up with GI so please call either [facility] or [facility] to schedule a close follow-up appointment and please consider taking the medicine to help with stomach acids.  Please rest and stay hydrated.  If any symptoms change or worsen acutely, please return to the nearest emergency department.

## 2024-04-26 NOTE — ED Notes (Signed)
 Reviewed discharge instructions, medications, and home care with pt. Pt verbalized understanding and had no further questions. Pt exited ED without complications.

## 2024-04-26 NOTE — ED Triage Notes (Signed)
 Patient reports cramping abdominal pain. Saw PCP today who was concerned for SBO. Denies past abd surgeries. Denies n/v/d.

## 2024-04-26 NOTE — ED Provider Notes (Signed)
 [place] EMERGENCY DEPARTMENT AT [facility] Provider Note   CSN: [id number] Arrival date & time: 04/26/24  1607     Patient presents with: Abdominal Pain   Kathy Zuniga is a 68 y.o. female.   The history is provided by the patient and medical records. No language interpreter was used.  Abdominal Pain Pain location:  Epigastric and RUQ Pain quality: aching, cramping and sharp   Pain radiates to:  Back and R flank Pain severity:  Severe Onset quality:  Gradual Duration:  5 days Timing:  Constant Progression:  Waxing and waning Relieved by:  Nothing Worsened by:  Palpation and eating Ineffective treatments:  None tried Associated symptoms: belching and nausea (mild with belching)   Associated symptoms: no chest pain, no chills, no constipation, no cough, no diarrhea, no dysuria, no fatigue, no fever, no melena, no shortness of breath and no vomiting        Prior to Admission medications   Medication Sig Start Date End Date Taking? Authorizing Provider  amoxicillin (AMOXIL) 500 MG capsule Take 500 mg by mouth 3 (three) times daily. 08/31/23   [provider]  Ascorbic Acid (VITAMIN C) 1000 MG tablet Take 1,000 mg by mouth See admin instructions. Take 1 tablet (1000 mg) by mouth up to 2 times daily    [provider]  BLACK CURRANT SEED OIL PO Take 1,250 mg by mouth daily in the afternoon.    [provider]  Cholecalciferol (VITAMIN D-3) 125 MCG (5000 UT) TABS Take 5,000 Units by mouth in the morning.    [provider]  LORazepam  (ATIVAN ) 1 MG tablet Take 1 tablet 30 minutes prior to leaving house on day of office surgery.  Bring second tablet with you to office on day of office surgery. Patient not taking: Reported on 04/16/2022 03/23/22   Sheree Penne Bruckner, MD  MAGNESIUM MALATE PO Take 950 mg by mouth at bedtime.    [provider]  Menaquinone-7 (VITAMIN K2 PO) Take 150 mcg by mouth in the morning.     [provider]  TURMERIC PO Take 1,000 mg by mouth in the morning.    [provider]    Allergies: Almond meal (obsolete), Asa [aspirin], and Ibuprofen     Review of Systems  Constitutional:  Negative for chills, diaphoresis, fatigue and fever.  HENT:  Negative for congestion.   Respiratory:  Negative for cough, chest tightness and shortness of breath.   Cardiovascular:  Negative for chest pain, palpitations and leg swelling.  Gastrointestinal:  Positive for abdominal pain and nausea (mild with belching). Negative for abdominal distention, constipation, diarrhea, melena and vomiting.  Genitourinary:  Negative for dysuria, flank pain and frequency.  Musculoskeletal:  Positive for back pain. Negative for neck pain and neck stiffness.  Skin:  Negative for rash and wound.  Neurological:  Negative for light-headedness and headaches.  Psychiatric/Behavioral:  Negative for agitation and confusion.   All other systems reviewed and are negative.   Updated Vital Signs BP (!) 146/75   Pulse (!) 59   Temp 98.4 F (36.9 C) (Oral)   Resp 16   SpO2 100%   Physical Exam Vitals and nursing note reviewed.  Constitutional:      General: She is not in acute distress.    Appearance: She is well-developed. She is not ill-appearing, toxic-appearing or diaphoretic.  HENT:     Head: Normocephalic and atraumatic.  Eyes:     Conjunctiva/sclera: Conjunctivae normal.  Cardiovascular:  Rate and Rhythm: Normal rate and regular rhythm.     Heart sounds: No murmur heard. Pulmonary:     Effort: Pulmonary effort is normal. No respiratory distress.     Breath sounds: Normal breath sounds. No wheezing, rhonchi or rales.  Chest:     Chest wall: No tenderness.  Abdominal:     General: Abdomen is flat. Bowel sounds are normal. There is no distension.     Palpations: Abdomen is soft.     Tenderness: There is abdominal tenderness in the right upper quadrant and epigastric area.   Musculoskeletal:        General: No swelling.     Cervical back: Neck supple.  Skin:    General: Skin is warm and dry.     Capillary Refill: Capillary refill takes less than 2 seconds.  Neurological:     Mental Status: She is alert.  Psychiatric:        Mood and Affect: Mood normal.     (all labs ordered are listed, but only abnormal results are displayed) Labs Reviewed  COMPREHENSIVE METABOLIC PANEL WITH GFR - Abnormal; Notable for the following components:      Result Value   Calcium 10.7 (*)    All other components within normal limits  URINALYSIS, ROUTINE W REFLEX MICROSCOPIC - Abnormal; Notable for the following components:   Color, Urine COLORLESS (*)    Ketones, ur 15 (*)    All other components within normal limits  LIPASE, BLOOD  CBC  LACTIC ACID, PLASMA    EKG: EKG Interpretation Date/Time:  Thursday April 26 2024 16:19:48 EDT Ventricular Rate:  66 PR Interval:  200 QRS Duration:  86 QT Interval:  416 QTC Calculation: 436 R Axis:   -14  Text Interpretation: Normal sinus rhythm Septal infarct (cited on or before 03-Feb-2016) Abnormal ECG When compared with ECG of 30-Sep-2023 14:54, No significant change was found when compared top rir, similar appearance No STEMI Confirmed by Ginger Barefoot ([id number]) on 04/26/2024 6:20:12 PM  Radiology: US  Abdomen Limited RUQ (LIVER/GB) Result Date: 04/26/2024 EXAM: Right Upper Quadrant Abdominal Ultrasound 04/26/2024 07:13:00 PM TECHNIQUE: Real-time ultrasonography of the right upper quadrant of the abdomen was performed. COMPARISON: None available. CLINICAL HISTORY: 151470 RUQ abdominal pain 151470 RUQ abdominal pain FINDINGS: LIVER: Increased hepatic echogenicity. No intrahepatic biliary ductal dilatation. No evidence of mass. BILIARY SYSTEM: No gallbladder wall thickening. No pericholecystic fluid. Negative sonographic Murphy's sign. No cholelithiasis. Common bile duct measures 3 mm. RIGHT KIDNEY: The right kidney is grossly  unremarkable in appearances without evidence of hydronephrosis, echogenic calculi or worrisome mass lesions. PANCREAS: Visualized portions of the pancreas are unremarkable. OTHER: No right upper quadrant ascites. IMPRESSION: 1. Hepatic steatosis. Electronically signed by: Kate Plummer MD 04/26/2024 07:16 PM EDT RP Workstation: [id number]   CT ABDOMEN PELVIS W CONTRAST Result Date: 04/26/2024 EXAM: CT ABDOMEN AND PELVIS WITH CONTRAST 04/26/2024 06:40:16 PM TECHNIQUE: CT of the abdomen and pelvis was performed with the administration of 100 mL of iohexol (OMNIPAQUE) 300 MG/ML solution. Multiplanar reformatted images are provided for review. Automated exposure control, iterative reconstruction, and/or weight-based adjustment of the mA/kV was utilized to reduce the radiation dose to as low as reasonably achievable. COMPARISON: None available. CLINICAL HISTORY: Abdominal pain, acute, nonlocalized; Upper abdominal pain, belching, severe discomfort. FINDINGS: LOWER CHEST: No acute abnormality. LIVER: The liver is unremarkable. GALLBLADDER AND BILE DUCTS: Gallbladder is unremarkable. No biliary ductal dilatation. SPLEEN: No acute abnormality. PANCREAS: No acute abnormality. ADRENAL GLANDS: No acute abnormality. KIDNEYS, URETERS  AND BLADDER: There is a subcentimeter cyst in the left kidney. Per consensus, no follow-up is needed for simple Bosniak type 1 and 2 renal cysts, unless the patient has a malignancy history or risk factors. No stones in the kidneys or ureters. No hydronephrosis. No perinephric or periureteral stranding. Urinary bladder is unremarkable. GI AND BOWEL: Stomach demonstrates no acute abnormality. Appendix appears normal. There is a large amount of stool throughout the colon. There is no bowel obstruction. PERITONEUM AND RETROPERITONEUM: No ascites. No free air. VASCULATURE: Aorta is normal in caliber. LYMPH NODES: No lymphadenopathy. REPRODUCTIVE ORGANS: No acute abnormality. BONES AND SOFT TISSUES:  No acute osseous abnormality. There is a small fat-containing umbilical hernia. IMPRESSION: 1. No acute findings in the abdomen or pelvis. 2. Large amount of stool throughout the colon. 3. Subcentimeter left renal cyst, likely benign Bosniak II; no follow-up imaging recommended. Electronically signed by: Greig Pique MD 04/26/2024 06:52 PM EDT RP Workstation: [id number]     Procedures   Medications Ordered in the ED  morphine (PF) 4 MG/ML injection 4 mg (4 mg Intravenous Given 04/26/24 1813)  ondansetron  (ZOFRAN ) injection 4 mg (4 mg Intravenous Given 04/26/24 1812)  sodium chloride  0.9 % bolus 1,000 mL (0 mLs Intravenous Stopped 04/26/24 2001)  iohexol (OMNIPAQUE) 300 MG/ML solution 100 mL (100 mLs Intravenous Contrast Given 04/26/24 1832)  alum & mag hydroxide-simeth (MAALOX/MYLANTA) 200-200-20 MG/5ML suspension 30 mL (30 mLs Oral Given 04/26/24 2040)    And  lidocaine (XYLOCAINE) 2 % viscous mouth solution 15 mL (15 mLs Oral Given 04/26/24 2040)                                    Medical Decision Making Amount and/or Complexity of Data Reviewed Labs: ordered. Radiology: ordered.  Risk OTC drugs. Prescription drug management.    Kathy Zuniga is a 68 y.o. female with a past medical history significant for migraines, thyroid  disease, possible rectal cancer, GERD, and osteoporosis who presents with abdominal pain.  According to patient, for the last 5 days she is been having worsening pain in her abdomen that is primarily upper abdomen but also diffusely.  She has had belching and some nausea but no vomiting.  She reports she still doing bowel movements and still passing gas without difficulty denies any melena.  She denies any urinary changes peer denies trauma.  Denies any head neck or chest pain.  Denies shortness of breath.  Denies significant cough.  She reports the pain is radiating around her right upper abdomen towards her back.  Denies history of gallbladder disease.   Denies previous abdominal surgeries.  She reports that eating does make her symptoms worse.  On exam, patient is tender in her periumbilical, epigastric and right upper abdomen.  The pain also wraps around towards her right flank but she was not as tender there.  No rash to suggest shingles.  I did hear good bowel sounds.  Lungs clear chest nontender.  No murmur.  Legs nontender.  Patient otherwise well-appearing.  Dry mucous membranes.  Patient had some screening labs in triage that did show some ketones indicating some possible dehydration so we will give some fluids but will give pain medicine nausea medicine and she will get imaging.  Will get a CT to rule out bowel obstruction or some other problem given this remote history in the chart of rectal cancer as well as rule out a cholecystitis  given the upper abdominal tenderness.  Anticipate reassessment after workup to determine disposition.  If workup negative, may be more gastritis or ulceration as she reports when she was much younger she had some ulcers before.  9:07 PM Workup overall reassuring.  CT and ultrasound not show acute surgical problem.  Some hepatic steatosis was seen but her LFTs were reassuring.  CBC reassuring.  Lactic acid normal.  Urinalysis did not show UTI only some ketones.  Patient is feeling much better after medications and fluids.  Suspect some gastritis or GI irritation and she can follow-up with outpatient GI.  Will give prescription for nausea medicine, Prilosec, and Carafate.  She understands return precautions and follow-up instructions and was discharged in good condition with reassuring workup.      Final diagnoses:  Epigastric pain  Nausea    ED Discharge Orders          Ordered    omeprazole (PRILOSEC) 20 MG capsule  Daily        04/26/24 2109    sucralfate (CARAFATE) 1 g tablet  3 times daily with meals & bedtime        04/26/24 2109           Clinical Impression: 1. Epigastric pain   2.  Nausea     Disposition: Discharge  Condition: Good  I have discussed the results, Dx and Tx plan with the pt(& family if present). He/she/they expressed understanding and agree(s) with the plan. Discharge instructions discussed at great length. Strict return precautions discussed and pt &/or family have verbalized understanding of the instructions. No further questions at time of discharge.    New Prescriptions   OMEPRAZOLE (PRILOSEC) 20 MG CAPSULE    Take 1 capsule (20 mg total) by mouth daily.   SUCRALFATE (CARAFATE) 1 G TABLET    Take 1 tablet (1 g total) by mouth 4 (four) times daily -  with meals and at bedtime.    Follow Up: Shona Norleen PEDLAR, MD [address] [place] [address] [phone]     [facility] [address] [place]  [address] [phone]    Gastroenterology, [place] [address] [place] [address] [phone]         Sanaz Scarlett, Lonni PARAS, MD 04/26/24 2111

## 2024-04-30 ENCOUNTER — Telehealth: Payer: Self-pay | Admitting: Gastroenterology

## 2024-04-30 NOTE — Telephone Encounter (Signed)
 Good morning Dr. Nandigam,   I received a call from this patient stating that she would like to transfer her care over to our practice. Patient has had a flex sigmoidoscopy with [facility] back in November of 2022. Patient records are under media tab ready for you to review. Would you please advise on how to schedule this patient.   Thank you.

## 2024-05-03 NOTE — Telephone Encounter (Signed)
 Unfortunately I do not have any available appointments, please check if any other providers in the practice is willing to take her.  Thanks

## 2024-05-03 NOTE — Telephone Encounter (Signed)
 Good afternoon Dr. Suzann,   I received a call from this patient back on November the third due to her wanting to transfer her care to our practice. I asked Dr. Nandigam and unfortunately she is not able to accept her because she has no appointments available. Would you please advise per the information below.   Thank you.

## 2024-05-14 ENCOUNTER — Other Ambulatory Visit

## 2024-05-28 DIAGNOSIS — L718 Other rosacea: Secondary | ICD-10-CM | POA: Diagnosis not present

## 2024-06-06 ENCOUNTER — Encounter: Payer: Self-pay | Admitting: Gastroenterology

## 2024-06-06 ENCOUNTER — Ambulatory Visit: Admitting: Gastroenterology

## 2024-06-06 VITALS — BP 135/79 | HR 74 | Temp 97.9°F | Ht 64.0 in | Wt 157.1 lb

## 2024-06-06 DIAGNOSIS — R1013 Epigastric pain: Secondary | ICD-10-CM | POA: Diagnosis not present

## 2024-06-06 DIAGNOSIS — R109 Unspecified abdominal pain: Secondary | ICD-10-CM | POA: Insufficient documentation

## 2024-06-06 DIAGNOSIS — R1011 Right upper quadrant pain: Secondary | ICD-10-CM | POA: Diagnosis not present

## 2024-06-06 DIAGNOSIS — F1090 Alcohol use, unspecified, uncomplicated: Secondary | ICD-10-CM | POA: Diagnosis not present

## 2024-06-06 DIAGNOSIS — Z85048 Personal history of other malignant neoplasm of rectum, rectosigmoid junction, and anus: Secondary | ICD-10-CM | POA: Diagnosis not present

## 2024-06-06 NOTE — Progress Notes (Signed)
 Gastroenterology Office Note    Referring Provider: Shona Norleen PEDLAR, MD Primary Care Physician:  Shona Norleen PEDLAR, MD  Primary GI: Dr Eartha    Chief Complaint   Chief Complaint  Patient presents with   New Patient (Initial Visit)    Patient here today as a new patient due to having issues with mid abdominal pain(cramping), also Right flank pain. Patient says the cramping in the mid abdomen has subside, but has occasional issues with the right flank pain. Patient denies any issues with nausea, vomiting, constipation or diarrhea. Patient had a ct pelvis and ultrasound preformed on 04/26/2024.     History of Present Illness   Kathy Zuniga is a 68 y.o. female presenting today due to abdominal pain with onset around Oct 2025. Pertinent medical history significant for rectal cancer diagnosed in 2021, s/p excision in 2023, last colonoscopy Oct 2024 on file from [facility].   She was seen in the ED 04/26/24. CT abd/pelvis with contrast with large amount of stool in colon. States when had CT scan, she hadn't been having as frequent BMs but this is not typical for her. Drinks lots of water and has BM twice a day. Constipation was short-lived at that time.  US  Oct 2025 no gallbladder wall thickening, no CBD dilation. No gallstones.  CBC normal, CMP normal, lipase normal in ED 04/26/24.   She had been doing well following this but then had recurrent pain recently. Week or so ago had cheeseburger and french fries then had RUQ and right side/flank pain. She had seen her PCP earlier in October; states she started having pain on a Monday after eating scramlbed eggs and half a bagel. Had pain following this. Continued throughout the day but subsided over next 24-48 hours. Tried it Wednesday again with eggs and had pain. Saw PCP and sent to ED, where she was seen 10/30. SABRA Pain was located in upper abdomen that was severe and felt like being hit with a baseball bat, mostly hurt in epigastric area but right  flank pain present during this time also. Has noted times in the past where she has had pain right-sided.   No GERD. No N/V. Denies constipation. States when had CT scan, she hadn't been having as frequent BMs but this is not typical for her. Drinks lots of water and has BM twice a day. No worsening prior to BMs or relief thereafter. No overt GI bleeding.    Having low back pain today, unrelated to persentation here.  She is concerned about her gallbladder. Doesn't necessarily want surgery; she just wants to know what is going on.    Oct 2024: patent end-to-side colorectal anastomosis with healthy appearing mucosa, tattoo in sigmoid, post polypectomy scar.    TEMS due to rectal mass Jul 01, 2021, Surgical pathology showed pT1 invasive rectal adenocarcinoma, well-differentiated, arising in an adenomatous polyp with extensive high-grade dysplasia    Flex sig 2022: tattoo at rectum that was normal, malignant tumor from 3-5 cm proximal to anus s/p biopsy. Path with fragments of tubular adenoma with foci of high-grade glandular dysplasia, no invasive carcinoma   Colonoscopy:06/2019 Colonoscopy - complete to terminal ileum. An ulcerated, non-obstructing mass was found in the rectum, partially circumferential (involving 1/3 of the lumen). 23 mm in size, biopsied and tattooed 3-4 cm proximal, two sessile polyps removed from the ascending colon and cecum, 3 and 6 mm polyps removed from the transverse colon, internal hemorrhoids. Path: adenomatous polyps in the colon.  Pathology: Tubulovillous adenoma with extensive high grade dysplasia.     Past Medical History:  Diagnosis Date   Arthritis    Back pain    Fracture of coccyx (HCC)    Frequent headaches    Migraines    No blood products 02/15/2017   Jehovah's Witness   No blood products    Jehovah Witness   Osteoporosis    Thyroid  disease     Past Surgical History:  Procedure Laterality Date   BIOPSY  05/13/2021   Procedure: BIOPSY;   Surgeon: Eartha Angelia Sieving, MD;  Location: [facility];  Service: Gastroenterology;;   ENDOVENOUS ABLATION SAPHENOUS VEIN W/ LASER Right 04/01/2022   endovenous laser ablation right greater saphenous vein and stab phlebectomy 10-20 incisions right leg by Penne Colorado MD   FLEXIBLE SIGMOIDOSCOPY N/A 05/13/2021   Procedure: ENID MORIN;  Surgeon: Eartha Angelia Sieving, MD;  Location: [facility];  Service: Gastroenterology;  Laterality: N/A;  1:45   WISDOM TOOTH EXTRACTION      Current Outpatient Medications  Medication Sig Dispense Refill   Ascorbic Acid (VITAMIN C) 1000 MG tablet Take 1,000 mg by mouth See admin instructions. Take 1 tablet (1000 mg) by mouth up to 2 times daily (Patient taking differently: Take 1,000 mg by mouth daily.)     BLACK CURRANT SEED OIL PO Take 1,250 mg by mouth daily in the afternoon. (Patient taking differently: Take 1,250 mg by mouth as needed.)     Cholecalciferol (VITAMIN D-3) 125 MCG (5000 UT) TABS Take 5,000 Units by mouth in the morning.     MAGNESIUM MALATE PO Take 950 mg by mouth at bedtime.     Menaquinone-7 (VITAMIN K2 PO) Take 150 mcg by mouth in the morning.     TURMERIC PO Take 1,000 mg by mouth in the morning.     No current facility-administered medications for this visit.    Allergies as of 06/06/2024 - Review Complete 06/06/2024  Allergen Reaction Noted   Almond meal (obsolete) Other (See Comments) 05/15/2016   Asa [aspirin] Other (See Comments) 07/11/2015   Ibuprofen  Other (See Comments) 07/11/2015    Family History  Problem Relation Age of Onset   Osteoporosis Mother        Mom passed away from pathological fractures   Arthritis Mother    CVA Mother        TIA's   Colon cancer Father 27   Heart failure Father    Arthritis Father    Drug abuse Brother    Heart disease Paternal Grandfather    Hypothyroidism Daughter    Diabetes Mellitus II Daughter    Diabetes Mellitus II Son     Social History    Socioeconomic History   Marital status: Married    Spouse name: Elsie   Number of children: 3   Years of education: Not on file   Highest education level: Not on file  Occupational History   Occupation: unemployed  Tobacco Use   Smoking status: Never   Smokeless tobacco: Never  Vaping Use   Vaping status: Never Used  Substance and Sexual Activity   Alcohol use: Yes    Alcohol/week: 6.0 standard drinks of alcohol    Types: 6 Standard drinks or equivalent per week    Comment: occas. wine   Drug use: No   Sexual activity: Not Currently    Birth control/protection: Post-menopausal  Other Topics Concern   Not on file  Social History Narrative   Work or  School:Retired, used to do housecleaning      Home Situation:lives with husband      Spiritual Beliefs:Jehovah's Witness      Lifestyle:walking 60 minutes daily and eating a healthy diet, does eat a lot of dairy   Social Drivers of Health   Financial Resource Strain: Low Risk  (10/01/2020)   Overall Financial Resource Strain (CARDIA)    Difficulty of Paying Living Expenses: Not hard at all  Food Insecurity: No Food Insecurity (10/01/2020)   Hunger Vital Sign    Worried About Running Out of Food in the Last Year: Never true    Ran Out of Food in the Last Year: Never true  Transportation Needs: No Transportation Needs (10/01/2020)   PRAPARE - Administrator, Civil Service (Medical): No    Lack of Transportation (Non-Medical): No  Physical Activity: Insufficiently Active (10/01/2020)   Exercise Vital Sign    Days of Exercise per Week: 4 days    Minutes of Exercise per Session: 30 min  Stress: No Stress Concern Present (10/01/2020)   [employer] of Occupational Health - Occupational Stress Questionnaire    Feeling of Stress : Not at all  Social Connections: Socially Integrated (10/01/2020)   Social Connection and Isolation Panel    Frequency of Communication with Friends and Family: More than three times a week     Frequency of Social Gatherings with Friends and Family: More than three times a week    Attends Religious Services: More than 4 times per year    Active Member of [employer] or Organizations: Yes    Attends Banker Meetings: Never    Marital Status: Married  Catering Manager Violence: Not At Risk (10/01/2020)   Humiliation, Afraid, Rape, and Kick questionnaire    Fear of Current or Ex-Partner: No    Emotionally Abused: No    Physically Abused: No    Sexually Abused: No     Review of Systems   Gen: Denies any fever, chills, fatigue, weight loss, lack of appetite.  CV: Denies chest pain, heart palpitations, peripheral edema, syncope.  Resp: Denies shortness of breath at rest or with exertion. Denies wheezing or cough.  GI: Denies dysphagia or odynophagia. Denies jaundice, hematemesis, fecal incontinence. GU : Denies urinary burning, urinary frequency, urinary hesitancy MS: Denies joint pain, muscle weakness, cramps, or limitation of movement.  Derm: Denies rash, itching, dry skin Psych: Denies depression, anxiety, memory loss, and confusion Heme: Denies bruising, bleeding, and enlarged lymph nodes.   Physical Exam   BP 135/79 (BP Location: Left Arm, Patient Position: Sitting, Cuff Size: Normal)   Pulse 74   Temp 97.9 F (36.6 C) (Oral)   Ht 5' 4 (1.626 m)   Wt 157 lb 1.6 oz (71.3 kg)   BMI 26.97 kg/m  General:   Alert and oriented. Pleasant and cooperative. Well-nourished and well-developed.  Head:  Normocephalic and atraumatic. Eyes:  Without icterus Ears:  Normal auditory acuity. Lungs:  Clear to auscultation bilaterally.  Heart:  S1, S2 present without murmurs appreciated.  Abdomen:  +BS, soft, non-tender and non-distended. No HSM noted. No guarding or rebound. No masses appreciated.  Rectal:  Deferred  Msk:  Symmetrical without gross deformities. Normal posture. Extremities:  Without edema. Neurologic:  Alert and  oriented x4;  grossly normal  neurologically. Skin:  Intact without significant lesions or rashes. Psych:  Alert and cooperative. Normal mood and affect.   Assessment   Kathy Zuniga is a 68 y.o. female  presenting today with a history of epigastric, RUQ, and right-sided flank pain that has been intermittent and improved by time of this visit but desires further evaluation to rule out biliary etiology. History significant for rectal cancer diagnosed in 2021 s/p excision in 2023.   Abdominal pain: noted postprandially and although does have epigastric pain with this, she also has right-sided abdominal pain, RUQ abdominal pain. No other signs such as GERD, nausea, vomiting. Although she did have stool burden on CT in Oct 2025, she notes this was a self-limiting bout of constipation and has no concerns with constipation now; she has still had bouts of abdominal pain despite no constipation. Gallbladder present and US  without wall thickening or gallstones. Labs have essentially been unrevealing. No prior EGD. We will arrange HIDA as could have biliary dyskinesia in light of the postprandial component. She may need an EGD if persists or any other concerning signs.  Hx of rectal cancer: s/p excision. Followed by Oncology. Colonoscopy up-to-date as of Oct 2024 with patent end-to-side colorectal anastomosis with healthy appearing mucosa, tattoo in sigmoid, post polypectomy scar.     PLAN   HIDA in near future  May need EGD  Low fat diet  Continue ongoing follow-up with Oncology due to history of rectal cancer: MRI pelvis and CT chest annually arranged by Oncology  High risk surveillance colonoscopy Oct 2027 or earlier if clinically indicated    Therisa MICAEL Stager, PhD, ANP-BC [facility] Gastroenterology   I have reviewed the note and agree with the APP's assessment as described in this progress note  Toribio Fortune, MD Gastroenterology and Hepatology [facility] Gastroenterology

## 2024-06-06 NOTE — Patient Instructions (Signed)
 We are arranging the HIDA scan in the near future!  Further recommendations to follow once this is completed!  Have a great holiday season!  It was a pleasure to see you today. I want to create trusting relationships with patients and provide genuine, compassionate, and quality care. I truly value your feedback, so please be on the lookout for a survey regarding your visit with me today. I appreciate your time in completing this!         Therisa MICAEL Stager, PhD, ANP-BC [facility] Gastroenterology

## 2024-06-08 ENCOUNTER — Emergency Department (HOSPITAL_COMMUNITY)
Admission: EM | Admit: 2024-06-08 | Discharge: 2024-06-08 | Disposition: A | Discharge status: Home / Self Care | Attending: Emergency Medicine | Admitting: Emergency Medicine

## 2024-06-08 ENCOUNTER — Telehealth: Payer: Self-pay | Admitting: Gastroenterology

## 2024-06-08 ENCOUNTER — Other Ambulatory Visit: Payer: Self-pay

## 2024-06-08 DIAGNOSIS — R10A1 Flank pain, right side: Secondary | ICD-10-CM | POA: Diagnosis not present

## 2024-06-08 DIAGNOSIS — Z85048 Personal history of other malignant neoplasm of rectum, rectosigmoid junction, and anus: Secondary | ICD-10-CM | POA: Diagnosis not present

## 2024-06-08 DIAGNOSIS — R109 Unspecified abdominal pain: Secondary | ICD-10-CM | POA: Diagnosis not present

## 2024-06-08 LAB — CBC WITH DIFFERENTIAL/PLATELET
Abs Immature Granulocytes: 0.01 K/uL (ref 0.00–0.07)
Basophils Absolute: 0 K/uL (ref 0.0–0.1)
Basophils Relative: 0 %
Eosinophils Absolute: 0.1 K/uL (ref 0.0–0.5)
Eosinophils Relative: 1 %
HCT: 43.9 % (ref 36.0–46.0)
Hemoglobin: 14.3 g/dL (ref 12.0–15.0)
Immature Granulocytes: 0 %
Lymphocytes Relative: 36 %
Lymphs Abs: 2.1 K/uL (ref 0.7–4.0)
MCH: 31.9 pg (ref 26.0–34.0)
MCHC: 32.6 g/dL (ref 30.0–36.0)
MCV: 98 fL (ref 80.0–100.0)
Monocytes Absolute: 0.6 K/uL (ref 0.1–1.0)
Monocytes Relative: 10 %
Neutro Abs: 3 K/uL (ref 1.7–7.7)
Neutrophils Relative %: 53 %
Platelets: 296 K/uL (ref 150–400)
RBC: 4.48 MIL/uL (ref 3.87–5.11)
RDW: 13.2 % (ref 11.5–15.5)
WBC: 5.7 K/uL (ref 4.0–10.5)
nRBC: 0 % (ref 0.0–0.2)

## 2024-06-08 LAB — COMPREHENSIVE METABOLIC PANEL WITH GFR
ALT: 23 U/L (ref 0–44)
AST: 24 U/L (ref 15–41)
Albumin: 4.5 g/dL (ref 3.5–5.0)
Alkaline Phosphatase: 102 U/L (ref 38–126)
Anion gap: 13 (ref 5–15)
BUN: 12 mg/dL (ref 8–23)
CO2: 22 mmol/L (ref 22–32)
Calcium: 10.1 mg/dL (ref 8.9–10.3)
Chloride: 105 mmol/L (ref 98–111)
Creatinine, Ser: 0.51 mg/dL (ref 0.44–1.00)
GFR, Estimated: >60 mL/min (ref >60)
Glucose, Bld: 86 mg/dL (ref 70–99)
Potassium: 3.7 mmol/L (ref 3.5–5.1)
Sodium: 140 mmol/L (ref 135–145)
Total Bilirubin: 0.5 mg/dL (ref 0.0–1.2)
Total Protein: 7.6 g/dL (ref 6.5–8.1)

## 2024-06-08 LAB — URINALYSIS, ROUTINE W REFLEX MICROSCOPIC
Bilirubin Urine: NEGATIVE
Glucose, UA: NEGATIVE mg/dL
Hgb urine dipstick: NEGATIVE
Ketones, ur: 5 mg/dL — AB
Leukocytes,Ua: NEGATIVE
Nitrite: NEGATIVE
Protein, ur: NEGATIVE mg/dL
Specific Gravity, Urine: 1.005 (ref 1.005–1.030)
pH: 6 (ref 5.0–8.0)

## 2024-06-08 LAB — LIPASE, BLOOD: Lipase: 25 U/L (ref 11–51)

## 2024-06-08 NOTE — Telephone Encounter (Signed)
 FYI;  Returned the pt's call and the pt is in severe pain since leaving here Wednesday. She is hurting more. Advised to go to the ED right now if possible to be evaluated. (Didn't see where HIDA scan had been schedule)

## 2024-06-08 NOTE — Discharge Instructions (Signed)
 You were evaluated emergency room for flank pain.  Your lab work did not show any significant abnormality.  Please follow-up with your GI doctor for further evaluation and further scheduling a HIDA scan.

## 2024-06-08 NOTE — ED Triage Notes (Signed)
 Pt states she does not want an ultrasound or a CT scan. She wants working gallbladder scan

## 2024-06-08 NOTE — ED Triage Notes (Addendum)
 Pt complains of right flank pain. Pt went to GI doctor on Wednesday and was told they would order a scan to check her gallbladder but order had not been placed. Pt called GI today and they advised she come to ER. No hx of kidney stones. Pt denies any urinary complaints. Pain has caused lower back to also hurt. Denies n/v/d

## 2024-06-08 NOTE — ED Provider Notes (Signed)
  EMERGENCY DEPARTMENT AT [facility] Provider Note   CSN: [id number] Arrival date & time: 06/08/24  1148     Patient presents with: Abdominal Pain and Flank Pain   Kathy Zuniga is a 68 y.o. female with history of rectal cancer status post excision in 2023 presents with complaints of right flank pain since 10/30.  Symptoms are positional.  Is not associated with any vomiting, diarrhea or postprandial pain.  Has no urinary symptoms.  There was no injury.  Denies any radicular symptoms.  Has not improved with muscle relaxers or pain pills.  Was evaluated in the ED at that time.  Her workup was overall reassuring and she was followed up with GI.  Had a negative ultrasound as well.  Was referred here for a HIDA scan.   =   Abdominal Pain Flank Pain Associated symptoms include abdominal pain.      Past Medical History:  Diagnosis Date   Arthritis    Back pain    Fracture of coccyx (HCC)    Frequent headaches    Migraines    No blood products 02/15/2017   Jehovah's Witness   No blood products    Jehovah Witness   Osteoporosis    Rectal cancer (HCC)    Thyroid  disease    Past Surgical History:  Procedure Laterality Date   BIOPSY  05/13/2021   Procedure: BIOPSY;  Surgeon: Eartha Angelia Sieving, MD;  Location: [facility];  Service: Gastroenterology;;   ENDOVENOUS ABLATION SAPHENOUS VEIN W/ LASER Right 04/01/2022   endovenous laser ablation right greater saphenous vein and stab phlebectomy 10-20 incisions right leg by Penne Colorado MD   FLEXIBLE SIGMOIDOSCOPY N/A 05/13/2021   Procedure: ENID MORIN;  Surgeon: Eartha Angelia Sieving, MD;  [facility]: [facility];  Service: Gastroenterology;  Laterality: N/A;  1:45   polyp removal  2023   WISDOM TOOTH EXTRACTION       Prior to Admission medications  Medication Sig Start Date End Date Taking? Authorizing Provider  Ascorbic Acid (VITAMIN C) 1000 MG tablet Take 1,000 mg by mouth  See admin instructions. Take 1 tablet (1000 mg) by mouth up to 2 times daily Patient taking differently: Take 1,000 mg by mouth daily.    [provider]  BLACK CURRANT SEED OIL PO Take 1,250 mg by mouth daily in the afternoon. Patient taking differently: Take 1,250 mg by mouth as needed.    [provider]  Cholecalciferol (VITAMIN D-3) 125 MCG (5000 UT) TABS Take 5,000 Units by mouth in the morning.    [provider]  MAGNESIUM MALATE PO Take 950 mg by mouth at bedtime.    [provider]  Menaquinone-7 (VITAMIN K2 PO) Take 150 mcg by mouth in the morning.    [provider]  TURMERIC PO Take 1,000 mg by mouth in the morning.    [provider]    Allergies: Almond meal (obsolete), Asa [aspirin], and Ibuprofen     Review of Systems  Gastrointestinal:  Positive for abdominal pain.  Genitourinary:  Positive for flank pain.    Updated Vital Signs BP 135/83 (BP Location: Right Arm)   Pulse 68   Temp 98 F (36.7 C) (Oral)   Resp 19   Ht 5' 4 (1.626 m)   Wt 71.3 kg   SpO2 99%   BMI 26.98 kg/m   Physical Exam Vitals and nursing note reviewed.  Constitutional:      General: She is not in acute  distress.    Appearance: She is well-developed.  HENT:     Head: Normocephalic and atraumatic.  Eyes:     Conjunctiva/sclera: Conjunctivae normal.  Cardiovascular:     Rate and Rhythm: Normal rate and regular rhythm.     Heart sounds: No murmur heard. Pulmonary:     Effort: Pulmonary effort is normal. No respiratory distress.     Breath sounds: Normal breath sounds.  Abdominal:     Palpations: Abdomen is soft.     Tenderness: There is no abdominal tenderness.  Musculoskeletal:        General: No swelling.     Cervical back: Neck supple.     Comments: Tenderness to right flank, no midline spinal tenderness  Skin:    General: Skin is warm and dry.     Capillary Refill: Capillary refill takes less than 2 seconds.  Neurological:      Mental Status: She is alert.  Psychiatric:        Mood and Affect: Mood normal.     (all labs ordered are listed, but only abnormal results are displayed) Labs Reviewed  URINALYSIS, ROUTINE W REFLEX MICROSCOPIC - Abnormal; Notable for the following components:      Result Value   Color, Urine STRAW (*)    Ketones, ur 5 (*)    All other components within normal limits  CBC WITH DIFFERENTIAL/PLATELET  COMPREHENSIVE METABOLIC PANEL WITH GFR  LIPASE, BLOOD    EKG: None  Radiology: No results found.   Procedures   Medications Ordered in the ED - No data to display  Clinical Course as of 06/08/24 1731  Fri Jun 08, 2024  1657 Patient evaluated for flank pain since 10/30.  Has had reassuring workups to this point was referred here for HIDA scan today.  Upon arrival patient is hemodynamically stable.  On exam she has no significant abdominal tenderness.  She does have tenderness to her right flank.  No midline spinal tenderness.  Has no radicular symptoms.  Will repeat lab work.   [JT]  [id number] Lab work is overall reassuring.  Unfortunately HIDA scan is unavailable today.  Offered something for pain.  She declines.  Patient will be discharged home. [JT]    Clinical Course User Index [JT] Donnajean Lynwood DEL, PA-C                                 Medical Decision Making Amount and/or Complexity of Data Reviewed Labs: ordered.   This patient presents to the ED with chief complaint(s) of Flank pain .  The complaint involves an extensive differential diagnosis and also carries with it a high risk of complications and morbidity.   Pertinent past medical history as listed in HPI  The differential diagnosis includes  Low suspicion for cholelithiasis or cholecystitis based off today's workup including recent CT scan and ultrasound.  She has no respiratory symptoms to suggest PE or pneumonia.  No evidence of metastases to the rib or fracture from prior CT.  Her symptoms are positional  and there are no lesions.  Do not suspect shingles.  She has no active chest pain.  She has very focal tenderness.  Do not suspect ACS or AAA.  No urinary symptoms.  Do not suspect urolithiasis, pyelonephritis.  Additional history obtained: Additional history obtained from spouse Records reviewed Care Everywhere/External Records  Disposition:   Patient will be discharged home. The patient has been appropriately  medically screened and/or stabilized in the ED. I have low suspicion for any other emergent medical condition which would require further screening, evaluation or treatment in the ED or require inpatient management. At time of discharge the patient is hemodynamically stable and in no acute distress. I have discussed work-up results and diagnosis with patient and answered all questions. Patient is agreeable with discharge plan. We discussed strict return precautions for returning to the emergency department and they verbalized understanding.     Social Determinants of Health:   none  This note was dictated with voice recognition software.  Despite best efforts at proofreading, errors may have occurred which can change the documentation meaning.       Final diagnoses:  Right flank pain    ED Discharge Orders     None          Sharone Almond H, PA-C 06/08/24 1731    Patsey Lot, MD 06/08/24 [phone]

## 2024-06-08 NOTE — Telephone Encounter (Signed)
 Patient called in to office pt states her side pain she was seen for on Wednesday has gotten worse. Transferred call to nurse.

## 2024-06-11 ENCOUNTER — Emergency Department (HOSPITAL_COMMUNITY)
Admission: EM | Admit: 2024-06-11 | Discharge: 2024-06-11 | Disposition: A | Discharge status: Home / Self Care | Attending: Emergency Medicine | Admitting: Emergency Medicine

## 2024-06-11 ENCOUNTER — Telehealth (INDEPENDENT_AMBULATORY_CARE_PROVIDER_SITE_OTHER): Payer: Self-pay

## 2024-06-11 ENCOUNTER — Emergency Department (HOSPITAL_COMMUNITY)

## 2024-06-11 ENCOUNTER — Other Ambulatory Visit: Payer: Self-pay

## 2024-06-11 ENCOUNTER — Encounter (HOSPITAL_COMMUNITY): Payer: Self-pay | Admitting: Emergency Medicine

## 2024-06-11 DIAGNOSIS — X58XXXA Exposure to other specified factors, initial encounter: Secondary | ICD-10-CM | POA: Diagnosis not present

## 2024-06-11 DIAGNOSIS — R1011 Right upper quadrant pain: Secondary | ICD-10-CM | POA: Diagnosis not present

## 2024-06-11 DIAGNOSIS — S22080A Wedge compression fracture of T11-T12 vertebra, initial encounter for closed fracture: Secondary | ICD-10-CM | POA: Diagnosis not present

## 2024-06-11 DIAGNOSIS — Z85048 Personal history of other malignant neoplasm of rectum, rectosigmoid junction, and anus: Secondary | ICD-10-CM | POA: Diagnosis not present

## 2024-06-11 DIAGNOSIS — S22088A Other fracture of T11-T12 vertebra, initial encounter for closed fracture: Secondary | ICD-10-CM | POA: Diagnosis not present

## 2024-06-11 DIAGNOSIS — R10A1 Flank pain, right side: Secondary | ICD-10-CM

## 2024-06-11 LAB — COMPREHENSIVE METABOLIC PANEL WITH GFR
ALT: 23 U/L (ref 0–44)
AST: 21 U/L (ref 15–41)
Albumin: 3.9 g/dL (ref 3.5–5.0)
Alkaline Phosphatase: 89 U/L (ref 38–126)
Anion gap: 8 (ref 5–15)
BUN: 7 mg/dL — ABNORMAL LOW (ref 8–23)
CO2: 26 mmol/L (ref 22–32)
Calcium: 10.3 mg/dL (ref 8.9–10.3)
Chloride: 106 mmol/L (ref 98–111)
Creatinine, Ser: 0.57 mg/dL (ref 0.44–1.00)
GFR, Estimated: >60 mL/min (ref >60)
Glucose, Bld: 91 mg/dL (ref 70–99)
Potassium: 4.2 mmol/L (ref 3.5–5.1)
Sodium: 140 mmol/L (ref 135–145)
Total Bilirubin: 0.5 mg/dL (ref 0.0–1.2)
Total Protein: 7.4 g/dL (ref 6.5–8.1)

## 2024-06-11 LAB — URINALYSIS, ROUTINE W REFLEX MICROSCOPIC
Bilirubin Urine: NEGATIVE
Glucose, UA: NEGATIVE mg/dL
Hgb urine dipstick: NEGATIVE
Ketones, ur: 5 mg/dL — AB
Leukocytes,Ua: NEGATIVE
Nitrite: NEGATIVE
Protein, ur: NEGATIVE mg/dL
Specific Gravity, Urine: 1.009 (ref 1.005–1.030)
pH: 7 (ref 5.0–8.0)

## 2024-06-11 LAB — CBC WITH DIFFERENTIAL/PLATELET
Abs Immature Granulocytes: 0.02 K/uL (ref 0.00–0.07)
Basophils Absolute: 0 K/uL (ref 0.0–0.1)
Basophils Relative: 0 %
Eosinophils Absolute: 0.1 K/uL (ref 0.0–0.5)
Eosinophils Relative: 1 %
HCT: 47.4 % — ABNORMAL HIGH (ref 36.0–46.0)
Hemoglobin: 15.5 g/dL — ABNORMAL HIGH (ref 12.0–15.0)
Immature Granulocytes: 0 %
Lymphocytes Relative: 30 %
Lymphs Abs: 1.8 K/uL (ref 0.7–4.0)
MCH: 32.2 pg (ref 26.0–34.0)
MCHC: 32.7 g/dL (ref 30.0–36.0)
MCV: 98.5 fL (ref 80.0–100.0)
Monocytes Absolute: 0.6 K/uL (ref 0.1–1.0)
Monocytes Relative: 9 %
Neutro Abs: 3.5 K/uL (ref 1.7–7.7)
Neutrophils Relative %: 60 %
Platelets: 309 K/uL (ref 150–400)
RBC: 4.81 MIL/uL (ref 3.87–5.11)
RDW: 13.1 % (ref 11.5–15.5)
WBC: 5.9 K/uL (ref 4.0–10.5)
nRBC: 0 % (ref 0.0–0.2)

## 2024-06-11 LAB — I-STAT CHEM 8, ED
BUN: 5 mg/dL — ABNORMAL LOW (ref 8–23)
Calcium, Ion: 0.95 mmol/L — ABNORMAL LOW (ref 1.15–1.40)
Chloride: 116 mmol/L — ABNORMAL HIGH (ref 98–111)
Creatinine, Ser: 0.3 mg/dL — ABNORMAL LOW (ref 0.44–1.00)
Glucose, Bld: 62 mg/dL — ABNORMAL LOW (ref 70–99)
HCT: 32 % — ABNORMAL LOW (ref 36.0–46.0)
Hemoglobin: 10.9 g/dL — ABNORMAL LOW (ref 12.0–15.0)
Potassium: 2.8 mmol/L — ABNORMAL LOW (ref 3.5–5.1)
Sodium: 149 mmol/L — ABNORMAL HIGH (ref 135–145)
TCO2: 17 mmol/L — ABNORMAL LOW (ref 22–32)

## 2024-06-11 LAB — LIPASE, BLOOD: Lipase: 27 U/L (ref 11–51)

## 2024-06-11 MED ORDER — PANTOPRAZOLE SODIUM 40 MG PO TBEC: 40.0000 mg | DELAYED_RELEASE_TABLET | Freq: Every day | ORAL | 3 refills | Status: AC

## 2024-06-11 MED ORDER — METHOCARBAMOL 500 MG PO TABS: 500.0000 mg | ORAL_TABLET | Freq: Two times a day (BID) | ORAL | 0 refills | Status: AC

## 2024-06-11 MED ORDER — ONDANSETRON 4 MG PO TBDP: 4.0000 mg | ORAL_TABLET | Freq: Three times a day (TID) | ORAL | 0 refills | Status: AC | PRN

## 2024-06-11 MED ORDER — OXYCODONE-ACETAMINOPHEN 5-325 MG PO TABS: 1.0000 | ORAL_TABLET | Freq: Four times a day (QID) | ORAL | 0 refills | Status: AC | PRN

## 2024-06-11 MED ORDER — METHOCARBAMOL 500 MG PO TABS
500.0000 mg | ORAL_TABLET | Freq: Once | ORAL | Status: AC
  Administered 2024-06-11: 17:00:00 500 mg via ORAL
  Filled 2024-06-11: qty 1

## 2024-06-11 MED ORDER — OXYCODONE HCL 5 MG PO TABS
5.0000 mg | ORAL_TABLET | Freq: Once | ORAL | Status: AC
  Administered 2024-06-11: 12:00:00 5 mg via ORAL
  Filled 2024-06-11: qty 1

## 2024-06-11 MED ORDER — ONDANSETRON HCL 4 MG/2ML IJ SOLN
4.0000 mg | Freq: Once | INTRAMUSCULAR | Status: AC
  Administered 2024-06-11: 11:00:00 4 mg via INTRAVENOUS
  Filled 2024-06-11: qty 2

## 2024-06-11 MED ORDER — IOHEXOL 350 MG/ML SOLN
75.0000 mL | Freq: Once | INTRAVENOUS | Status: AC | PRN
  Administered 2024-06-11: 12:00:00 75 mL via INTRAVENOUS

## 2024-06-11 NOTE — Telephone Encounter (Signed)
 PA on [facility] for HIDA: Order ID: [id number]       Authorized Approval Valid Through: 06/11/2024 - 07/10/2024

## 2024-06-11 NOTE — ED Provider Notes (Signed)
 [place] EMERGENCY DEPARTMENT AT [facility] Provider Note   CSN: [id number] Arrival date & time: 06/11/24  9055    Patient presents with: Abdominal Pain   Kathy Zuniga is a 68 y.o. female here for evaluation of flank pain.  Has an ongoing flank pain over the last 6 weeks.  Worse with movement, and food intake.  She has been seen twice in the ED for this previously.  She has a HIDA scan scheduled with her GI provider on Wednesday, less than 48 hours from now.  She has been trying to rotate Tylenol  and ibuprofen  at home without relief.  She denies any recent falls or injuries.  No prior blood per rectum or melena.  No chest pain, shortness of breath.  She has a prior history of rectal cancer s/p surgical intervention.  No pain or swelling to lower legs.  Patient states she is continuing to have pain which led her here hoping that she could get a HIDA scan from the emergency department.  No cough, fever, vomiting, dysuria, hematuria.  No rashes or lesions.        HPI     Prior to Admission medications  Medication Sig Start Date End Date Taking? Authorizing Provider  methocarbamol  (ROBAXIN ) 500 MG tablet Take 1 tablet (500 mg total) by mouth 2 (two) times daily. 06/11/24  Yes Akai Dollard A, PA-C  ondansetron  (ZOFRAN -ODT) 4 MG disintegrating tablet Take 1 tablet (4 mg total) by mouth every 8 (eight) hours as needed. 06/11/24  Yes Olubunmi Rothenberger A, PA-C  oxyCODONE -acetaminophen  (PERCOCET/ROXICET) 5-325 MG tablet Take 1 tablet by mouth every 6 (six) hours as needed for severe pain (pain score 7-10). 06/11/24  Yes Keiron Iodice A, PA-C  Ascorbic Acid (VITAMIN C) 1000 MG tablet Take 1,000 mg by mouth See admin instructions. Take 1 tablet (1000 mg) by mouth up to 2 times daily Patient taking differently: Take 1,000 mg by mouth daily.    [provider]  BLACK CURRANT SEED OIL PO Take 1,250 mg by mouth daily in the afternoon. Patient taking differently:  Take 1,250 mg by mouth as needed.    [provider]  Cholecalciferol (VITAMIN D-3) 125 MCG (5000 UT) TABS Take 5,000 Units by mouth in the morning.    [provider]  MAGNESIUM MALATE PO Take 950 mg by mouth at bedtime.    [provider]  Menaquinone-7 (VITAMIN K2 PO) Take 150 mcg by mouth in the morning.    [provider]  pantoprazole  (PROTONIX ) 40 MG tablet Take 1 tablet (40 mg total) by mouth daily. 30 minutes before breakfast 06/11/24   Shirlean Therisa ORN, NP  TURMERIC PO Take 1,000 mg by mouth in the morning.    [provider]    Allergies: Almond meal (obsolete), Asa [aspirin], and Ibuprofen     Review of Systems  Constitutional: Negative.   HENT: Negative.    Respiratory: Negative.    Cardiovascular: Negative.   Gastrointestinal:  Positive for abdominal pain and nausea. Negative for abdominal distention, anal bleeding, blood in stool, constipation, diarrhea, rectal pain and vomiting.  Genitourinary:  Positive for flank pain. Negative for decreased urine volume, difficulty urinating, dyspareunia, dysuria, enuresis, frequency, hematuria, pelvic pain and urgency.  Musculoskeletal:  Positive for back pain.  Skin: Negative.   Neurological: Negative.   All other systems reviewed and are negative.   Updated Vital Signs BP (!) 145/79 (BP Location: Left Arm)   Pulse 74   Temp (!) 97.5  F (36.4 C)   Resp 18   SpO2 100%   Physical Exam Vitals and nursing note reviewed.  Constitutional:      General: She is not in acute distress.    Appearance: She is well-developed. She is not ill-appearing, toxic-appearing or diaphoretic.  HENT:     Head: Normocephalic and atraumatic.  Eyes:     Pupils: Pupils are equal, round, and reactive to light.  Cardiovascular:     Rate and Rhythm: Normal rate.     Pulses: Normal pulses.          Radial pulses are 2+ on the right side and 2+ on the left side.       Dorsalis pedis pulses are 2+ on the right  side and 2+ on the left side.     Heart sounds: Normal heart sounds.  Pulmonary:     Effort: Pulmonary effort is normal. No respiratory distress.     Breath sounds: Normal breath sounds.  Abdominal:     General: Bowel sounds are normal. There is no distension.     Palpations: Abdomen is soft.     Tenderness: There is right CVA tenderness. There is no guarding. Negative signs include Murphy's sign and McBurney's sign.     Comments: Diffuse tenderness with deep palpation to right mid abd and right flank. No overlying skin changes  Musculoskeletal:        General: Normal range of motion.     Cervical back: Normal range of motion.     Comments: Tenderness to right paraspinal distal thoracic region. Neg slr.  Nontender bilateral upper and lower extremities.  Compartment soft, full range of motion  Skin:    General: Skin is warm and dry.     Capillary Refill: Capillary refill takes less than 2 seconds.     Comments: No rashes or lesions, specifically no vesicular lesions to right abdomen or flank.  No ecchymosis  Neurological:     General: No focal deficit present.     Mental Status: She is alert.     Cranial Nerves: Cranial nerves 2-12 are intact.     Sensory: Sensation is intact.     Motor: Motor function is intact.     Gait: Gait is intact.  Psychiatric:        Mood and Affect: Mood normal.     (all labs ordered are listed, but only abnormal results are displayed) Labs Reviewed  COMPREHENSIVE METABOLIC PANEL WITH GFR - Abnormal; Notable for the following components:      Result Value   BUN 7 (*)    All other components within normal limits  CBC WITH DIFFERENTIAL/PLATELET - Abnormal; Notable for the following components:   Hemoglobin 15.5 (*)    HCT 47.4 (*)    All other components within normal limits  URINALYSIS, ROUTINE W REFLEX MICROSCOPIC - Abnormal; Notable for the following components:   Ketones, ur 5 (*)    All other components within normal limits  I-STAT CHEM 8, ED -  Abnormal; Notable for the following components:   Sodium 149 (*)    Potassium 2.8 (*)    Chloride 116 (*)    BUN 5 (*)    Creatinine, Ser 0.30 (*)    Glucose, Bld 62 (*)    Calcium, Ion 0.95 (*)    TCO2 17 (*)    Hemoglobin 10.9 (*)    HCT 32.0 (*)    All other components within normal limits  LIPASE, BLOOD  EKG: None  Radiology: CT ABDOMEN PELVIS W CONTRAST Result Date: 06/11/2024 CLINICAL DATA:  Right flank pain. EXAM: CT ABDOMEN AND PELVIS WITH CONTRAST TECHNIQUE: Multidetector CT imaging of the abdomen and pelvis was performed using the standard protocol following bolus administration of intravenous contrast. RADIATION DOSE REDUCTION: This exam was performed according to the departmental dose-optimization program which includes automated exposure control, adjustment of the mA and/or kV according to patient size and/or use of iterative reconstruction technique. CONTRAST:  75mL OMNIPAQUE  IOHEXOL  350 MG/ML SOLN COMPARISON:  CT abdomen pelvis dated 04/26/2024. FINDINGS: Lower chest: Bibasilar atelectasis/scarring. No intra-abdominal free air or free fluid. Hepatobiliary: The liver is unremarkable. No biliary dilatation. The gallbladder is unremarkable. Pancreas: Unremarkable. No pancreatic ductal dilatation or surrounding inflammatory changes. Spleen: Normal in size without focal abnormality. Adrenals/Urinary Tract: The adrenal glands are unremarkable. Small left renal cyst. There is no hydronephrosis on either side. There is symmetric enhancement and excretion of contrast by both kidneys. The visualized ureters and urinary bladder appear unremarkable. Stomach/Bowel: There is no bowel obstruction or active inflammation. The appendix is normal. Vascular/Lymphatic: Mild aortoiliac atherosclerotic disease. The IVC is unremarkable. No portal venous gas. There is no adenopathy. Reproductive: The uterus is grossly unremarkable no suspicious adnexal masses. Mildly dilated left gonadal vein, likely  related to reflux. Other: Small fat containing umbilical hernia. Musculoskeletal: Osteopenia. Mild compression fracture of superior endplate of T12 with approximately 25% loss of vertebral body height, new since the prior CT. This may be acute or subacute. Clinical correlation is recommended. IMPRESSION: 1. No acute intra-abdominal or pelvic pathology. No bowel obstruction. Normal appendix. 2. Mild compression fracture of superior endplate of T12, new since the prior CT. Correlation with clinical exam and point tenderness recommended. 3.  Aortic Atherosclerosis (ICD10-I70.0). Electronically Signed   By: Vanetta Chou M.D.   On: 06/11/2024 13:12     .Ortho Injury Treatment  Date/Time: 06/11/2024 5:41 PM  Performed by: Edie Rosebud LABOR, PA-C Authorized by: Edie Rosebud LABOR, PA-C   Consent:    Consent obtained:  Verbal   Consent given by:  Patient, parent, spouse, guardian and healthcare agent   Risks discussed:  Fracture, nerve damage, restricted joint movement, vascular damage, stiffness, recurrent dislocation and irreducible dislocation   Alternatives discussed:  No treatment, alternative treatment, immobilization, referral and delayed treatmentInjury location: spine Location details T12 Injury type: fracture Fracture type: vertebral body Pre-procedure neurovascular assessment: neurovascularly intact Pre-procedure distal perfusion: normal Pre-procedure neurological function: normal Pre-procedure range of motion: normal  Anesthesia: Local anesthesia used: no  Patient sedated: NoManipulation performed: no Immobilization: splint (tslo brace) Splint Applied by: Kemp Balm Post-procedure neurovascular assessment: post-procedure neurovascularly intact Post-procedure distal perfusion: normal Post-procedure neurological function: normal Post-procedure range of motion: normal      Medications Ordered in the ED  oxyCODONE  (Oxy IR/ROXICODONE ) immediate release tablet 5 mg (5 mg Oral  Given 06/11/24 1130)  ondansetron  (ZOFRAN ) injection 4 mg (4 mg Intravenous Given 06/11/24 1129)  iohexol  (OMNIPAQUE ) 350 MG/ML injection 75 mL (75 mLs Intravenous Contrast Given 06/11/24 1229)  methocarbamol  (ROBAXIN ) tablet 500 mg (500 mg Oral Given 06/11/24 6819)   68 year old here for evaluation of right flank pain.  Has been ongoing over the last 6 weeks.  Has been seen in the ED as well as GI has outpatient HIDA scan ordered.  Worse with movement, eating or drinking.  Pain radiates from right flank into right abdomen.  Had a CT scan done about 6 weeks ago as well as ultrasound which did not  show any significant findings.  Here she has a nonfocal neuroexam.  She is diffusely tender to her right flank and right thoracic paraspinal region.  She has full range of motion to her extremities.  Neurovascularly intact.  No chest pain, shortness of breath, cough to suggest PE, infection, pneumothorax, fluid overload.  No rashes or lesions to suggest zoster.  No urinary symptoms.  Patient was hoping to expedite a HIDA scan she has one scheduled for 48 hours from now, unfortunately HIDA scan not available at this time.  Workup started from triage.  Labs and imaging ordered from triage personally viewed interpreted:  CBC without leukocytosis I-STAT Chem-8 with abnormalities however about panel without significant abnormality Metabolic panel without significant abnormality Lipase 27 UA negative for infection, blood CT abdomen pelvis with T12 compression fracture  Patient reassessed.  Discussed labs and imaging.  Discussed chest imaging to rule out additional etiology of her flank pain however patient declined.  Discussed risk versus benefit.  Voiced understanding however declines additional imaging at this time.  Wells criteria low risk.  Discussed pain management here.  She would like to try and get to her outpatient HIDA scan which I feel is reasonable.  She denies any recent falls or injuries however does  have known history of osteoporosis.  Question whether her T12 compression fracture is contributing to her pain especially when she has pain with movement.  Will place in a T SLO brace, provide pain management.  Encouraged close follow-up outpatient, return for new or worsening symptoms.  Ambulatory here. Tolerating po intake.   Patient is nontoxic, nonseptic appearing, in no apparent distress.  Patient's pain and other symptoms adequately managed in emergency department.  Fluid bolus given.  Labs, imaging and vitals reviewed.  Patient does not meet the SIRS or Sepsis criteria.  On repeat exam patient does not have a surgical abdomin and there are no peritoneal signs.  No indication of appendicitis, bowel obstruction, bowel perforation, cholecystitis, diverticulitis, PID, intermittent, persistent torsion, TOA, ectopic pregnancy, AAA, dissection, traumatic injury, pneumonia, pneumothorax, fluid overload, PE, ACS, cauda equina, discitis, osteomyelitis.  Patient discharged home with symptomatic treatment and given strict instructions for follow-up with their primary care physician.  I have also discussed reasons to return immediately to the ER.  Patient expresses understanding and agrees with plan.                                     Medical Decision Making Amount and/or Complexity of Data Reviewed Independent Historian: spouse External Data Reviewed: labs, radiology and notes. Labs: ordered. Decision-making details documented in ED Course. Radiology: ordered and independent interpretation performed. Decision-making details documented in ED Course.  Risk OTC drugs. Prescription drug management. Decision regarding hospitalization. Diagnosis or treatment significantly limited by social determinants of health.       Final diagnoses:  Right flank pain  Compression fracture of T12 vertebra, initial encounter [facility])    ED Discharge Orders          Ordered    oxyCODONE -acetaminophen   (PERCOCET/ROXICET) 5-325 MG tablet  Every 6 hours PRN        06/11/24 1633    methocarbamol  (ROBAXIN ) 500 MG tablet  2 times daily        06/11/24 1633    ondansetron  (ZOFRAN -ODT) 4 MG disintegrating tablet  Every 8 hours PRN        06/11/24 1633  Edie Rosebud LABOR, PA-C 06/11/24 1743    Jerrol Agent, MD 06/11/24 2041

## 2024-06-11 NOTE — ED Provider Triage Note (Signed)
 Emergency Medicine Provider Triage Evaluation Note  Kathy Zuniga , a 68 y.o. female  was evaluated in triage.  Pt complains of right flank pain.  Patient was previously seen for similar at [facility], patient states that she has been experiencing abdominal as well as right flank pain for approximately a month and a half. Patient previously had CT scan of abdomen pelvis as well as right upper quadrant ultrasound completed back on 04/26/2024, patient was ultimately referred to GI who recommended a HIDA scan.  Patient has struggled to get the HIDA scan scheduled however just recently scheduled it for Wednesday, patient still came in because she is in such severe pain.  Review of Systems  Positive: Nausea, vomiting, abdominal pain, flank pain Negative: Fever, chills, chest pain, shortness of breath, urinary symptoms  Physical Exam  BP (!) 145/79 (BP Location: Left Arm)   Pulse 74   Temp (!) 97.5 F (36.4 C)   Resp 18   SpO2 100%  Gen:   Awake, no distress   Resp:  Normal effort  MSK:   Moves extremities without difficulty  Other:  Abdomen soft and nontender, right flank pain with palpation, no true CVA tenderness  Medical Decision Making  Medically screening exam initiated at 11:18 AM.  Appropriate orders placed.  Kathy Zuniga was informed that the remainder of the evaluation will be completed by another provider, this initial triage assessment does not replace that evaluation, and the importance of remaining in the ED until their evaluation is complete.  Orders: CBC, CMP, UA, lipase, CT abdomen pelvis with contrast, Roxicodone , Zofran    Kathy Terrall FALCON, PA-C 06/11/24 1121

## 2024-06-11 NOTE — Telephone Encounter (Signed)
 Noted. She is in ED> I have sent her a clinical cytogeneticist message.

## 2024-06-11 NOTE — Discharge Instructions (Signed)
 It was a pleasure taking care of you here today  We had offered imaging of your chest which you have declined.  The CT scan of your abdomen and your labs as well as urine sample did not show any significant abnormality.  As we discussed we are treating you with 2 different types of pain management.  Please use caution as the muscle relaxer-Robaxin  and the opiate pain medication-Percocet can cause sleepiness.  Do not take together.  I would wait a few hours between taking the medications.  Stop taking the ibuprofen  as this can contribute to GI bleeds.  Keep your HIDA appointment.  Return for new or worsening symptoms

## 2024-06-11 NOTE — ED Triage Notes (Signed)
 Pt complains of right flank pain for over 1 month. Was seen at [facility] last week for same. Pt has nausea and diarrhea and concerned with gallbladder. HIDA scan order for Wednesday of this week but pain is uncontrolled and couldn't wait.

## 2024-06-11 NOTE — Progress Notes (Signed)
 Orthopedic Tech Progress Note Patient Details:  Kathy Zuniga 09/16/1955 [id number] [employer] TLSO to PT Ortho Devices Type of Ortho Device: Thoracolumbar corset (TLSO) Ortho Device/Splint Location: Back Ortho Device/Splint Interventions: Ordered, Adjustment, Application   Post Interventions Patient Tolerated: Well  Yolander Goodie A Lukis Bunt 06/11/2024, 5:31 PM

## 2024-06-11 NOTE — Telephone Encounter (Signed)
 noted

## 2024-06-13 ENCOUNTER — Encounter
